# Patient Record
Sex: Female | Born: 1965
Health system: Southern US, Community
[De-identification: ages and names within clinical notes are randomized; demographics above are authoritative.]

## PROBLEM LIST (undated history)

## (undated) DIAGNOSIS — J310 Chronic rhinitis: Secondary | ICD-10-CM

## (undated) DIAGNOSIS — K648 Other hemorrhoids: Secondary | ICD-10-CM

## (undated) DIAGNOSIS — R112 Nausea with vomiting, unspecified: Secondary | ICD-10-CM

## (undated) DIAGNOSIS — Z9889 Other specified postprocedural states: Secondary | ICD-10-CM

## (undated) DIAGNOSIS — J3501 Chronic tonsillitis: Secondary | ICD-10-CM

## (undated) DIAGNOSIS — Z973 Presence of spectacles and contact lenses: Secondary | ICD-10-CM

## (undated) DIAGNOSIS — Z9109 Other allergy status, other than to drugs and biological substances: Secondary | ICD-10-CM

## (undated) DIAGNOSIS — J3489 Other specified disorders of nose and nasal sinuses: Secondary | ICD-10-CM

## (undated) HISTORY — PX: OVARIAN CYST SURGERY: SHX726

## (undated) HISTORY — PX: DILATION AND CURETTAGE OF UTERUS: SHX78

## (undated) HISTORY — PX: ABDOMINAL HYSTERECTOMY: SHX81

---

## 1997-08-29 HISTORY — PX: TOTAL VAGINAL HYSTERECTOMY: SHX2548

## 1997-11-27 HISTORY — PX: LAPAROSCOPIC SALPINGOOPHERECTOMY: SUR795

## 1998-05-20 ENCOUNTER — Inpatient Hospital Stay (HOSPITAL_COMMUNITY): Admission: RE | Admit: 1998-05-20 | Discharge: 1998-05-22 | Payer: Self-pay | Admitting: Obstetrics and Gynecology

## 1999-03-05 ENCOUNTER — Ambulatory Visit (HOSPITAL_COMMUNITY): Admission: RE | Admit: 1999-03-05 | Discharge: 1999-03-05 | Payer: Self-pay | Admitting: Family Medicine

## 1999-03-05 ENCOUNTER — Encounter: Payer: Self-pay | Admitting: Family Medicine

## 1999-08-24 ENCOUNTER — Encounter: Payer: Self-pay | Admitting: Family Medicine

## 1999-08-24 ENCOUNTER — Ambulatory Visit (HOSPITAL_COMMUNITY): Admission: RE | Admit: 1999-08-24 | Discharge: 1999-08-24 | Payer: Self-pay | Admitting: Family Medicine

## 2001-02-23 ENCOUNTER — Other Ambulatory Visit: Admission: RE | Admit: 2001-02-23 | Discharge: 2001-02-23 | Payer: Self-pay | Admitting: Obstetrics and Gynecology

## 2002-02-27 ENCOUNTER — Other Ambulatory Visit: Admission: RE | Admit: 2002-02-27 | Discharge: 2002-02-27 | Payer: Self-pay | Admitting: Obstetrics and Gynecology

## 2003-04-28 ENCOUNTER — Other Ambulatory Visit: Admission: RE | Admit: 2003-04-28 | Discharge: 2003-04-28 | Payer: Self-pay | Admitting: Obstetrics and Gynecology

## 2004-08-10 ENCOUNTER — Encounter: Admission: RE | Admit: 2004-08-10 | Discharge: 2004-08-10 | Payer: Self-pay | Admitting: Obstetrics and Gynecology

## 2004-09-24 ENCOUNTER — Other Ambulatory Visit: Admission: RE | Admit: 2004-09-24 | Discharge: 2004-09-24 | Payer: Self-pay | Admitting: Obstetrics and Gynecology

## 2005-10-28 ENCOUNTER — Encounter: Admission: RE | Admit: 2005-10-28 | Discharge: 2005-10-28 | Payer: Self-pay | Admitting: Obstetrics and Gynecology

## 2006-07-28 ENCOUNTER — Ambulatory Visit (HOSPITAL_COMMUNITY): Admission: RE | Admit: 2006-07-28 | Discharge: 2006-07-28 | Payer: Self-pay | Admitting: Obstetrics and Gynecology

## 2006-07-28 ENCOUNTER — Encounter (INDEPENDENT_AMBULATORY_CARE_PROVIDER_SITE_OTHER): Payer: Self-pay | Admitting: *Deleted

## 2006-07-28 HISTORY — PX: LAPAROSCOPIC SALPINGO OOPHERECTOMY: SHX5927

## 2006-11-16 ENCOUNTER — Encounter: Admission: RE | Admit: 2006-11-16 | Discharge: 2006-11-16 | Payer: Self-pay | Admitting: Obstetrics and Gynecology

## 2006-11-24 ENCOUNTER — Encounter: Admission: RE | Admit: 2006-11-24 | Discharge: 2006-11-24 | Payer: Self-pay | Admitting: Obstetrics and Gynecology

## 2007-12-12 ENCOUNTER — Encounter: Admission: RE | Admit: 2007-12-12 | Discharge: 2007-12-12 | Payer: Self-pay | Admitting: Obstetrics and Gynecology

## 2009-06-03 ENCOUNTER — Encounter: Admission: RE | Admit: 2009-06-03 | Discharge: 2009-06-03 | Payer: Self-pay | Admitting: Obstetrics and Gynecology

## 2009-07-26 ENCOUNTER — Emergency Department (HOSPITAL_COMMUNITY): Admission: EM | Admit: 2009-07-26 | Discharge: 2009-07-26 | Payer: Self-pay | Admitting: Emergency Medicine

## 2009-08-13 HISTORY — PX: BACK SURGERY: SHX140

## 2009-09-02 ENCOUNTER — Encounter: Admission: RE | Admit: 2009-09-02 | Discharge: 2009-09-02 | Payer: Self-pay | Admitting: Surgery

## 2010-06-17 ENCOUNTER — Encounter: Admission: RE | Admit: 2010-06-17 | Discharge: 2010-06-17 | Payer: Self-pay | Admitting: Obstetrics and Gynecology

## 2010-12-01 LAB — URINALYSIS, ROUTINE W REFLEX MICROSCOPIC
Glucose, UA: NEGATIVE mg/dL
Hgb urine dipstick: NEGATIVE
Nitrite: NEGATIVE
Protein, ur: NEGATIVE mg/dL
pH: 5.5 (ref 5.0–8.0)

## 2010-12-01 LAB — DIFFERENTIAL
Basophils Relative: 0 % (ref 0–1)
Lymphocytes Relative: 30 % (ref 12–46)
Monocytes Absolute: 0.2 10*3/uL (ref 0.1–1.0)
Monocytes Relative: 3 % (ref 3–12)

## 2010-12-01 LAB — CBC
Platelets: 144 10*3/uL — ABNORMAL LOW (ref 150–400)
RDW: 13.1 % (ref 11.5–15.5)
WBC: 6.7 10*3/uL (ref 4.0–10.5)

## 2010-12-01 LAB — BASIC METABOLIC PANEL
Chloride: 101 mEq/L (ref 96–112)
GFR calc Af Amer: >60 mL/min (ref >60)
Potassium: 4.6 mEq/L (ref 3.5–5.1)
Sodium: 135 mEq/L (ref 135–145)

## 2010-12-07 ENCOUNTER — Other Ambulatory Visit: Payer: Self-pay | Admitting: Family Medicine

## 2010-12-07 DIAGNOSIS — M545 Low back pain, unspecified: Secondary | ICD-10-CM

## 2010-12-08 ENCOUNTER — Other Ambulatory Visit: Payer: Self-pay

## 2010-12-10 ENCOUNTER — Ambulatory Visit
Admission: RE | Admit: 2010-12-10 | Discharge: 2010-12-10 | Disposition: A | Discharge status: Home / Self Care | Payer: PRIVATE HEALTH INSURANCE | Source: Ambulatory Visit | Attending: Family Medicine | Admitting: Family Medicine

## 2010-12-10 DIAGNOSIS — M545 Low back pain, unspecified: Secondary | ICD-10-CM

## 2010-12-31 ENCOUNTER — Encounter (HOSPITAL_COMMUNITY)
Admission: RE | Admit: 2010-12-31 | Discharge: 2010-12-31 | Disposition: A | Discharge status: Home / Self Care | Payer: PRIVATE HEALTH INSURANCE | Source: Ambulatory Visit | Attending: Neurosurgery | Admitting: Neurosurgery

## 2010-12-31 LAB — CBC
Hemoglobin: 13.9 g/dL (ref 12.0–15.0)
MCH: 31.7 pg (ref 26.0–34.0)
MCHC: 35.4 g/dL (ref 30.0–36.0)
MCV: 89.7 fL (ref 78.0–100.0)
Platelets: 126 10*3/uL — ABNORMAL LOW (ref 150–400)
RBC: 4.38 MIL/uL (ref 3.87–5.11)
RDW: 12.4 % (ref 11.5–15.5)
WBC: 5.6 10*3/uL (ref 4.0–10.5)

## 2010-12-31 LAB — BASIC METABOLIC PANEL
BUN: 21 mg/dL (ref 6–23)
CO2: 29 mEq/L (ref 19–32)
Calcium: 9.6 mg/dL (ref 8.4–10.5)
Chloride: 104 mEq/L (ref 96–112)
GFR calc Af Amer: >60 mL/min (ref >60)
Glucose, Bld: 85 mg/dL (ref 70–99)
Potassium: 4.2 mEq/L (ref 3.5–5.1)
Sodium: 139 mEq/L (ref 135–145)

## 2010-12-31 LAB — SURGICAL PCR SCREEN: Staphylococcus aureus: NEGATIVE

## 2011-01-06 ENCOUNTER — Ambulatory Visit (HOSPITAL_COMMUNITY): Payer: PRIVATE HEALTH INSURANCE

## 2011-01-06 ENCOUNTER — Ambulatory Visit (HOSPITAL_COMMUNITY)
Admission: RE | Admit: 2011-01-06 | Discharge: 2011-01-07 | Disposition: A | Discharge status: Home / Self Care | Payer: PRIVATE HEALTH INSURANCE | Source: Ambulatory Visit | Attending: Neurosurgery | Admitting: Neurosurgery

## 2011-01-06 DIAGNOSIS — M5126 Other intervertebral disc displacement, lumbar region: Secondary | ICD-10-CM | POA: Insufficient documentation

## 2011-01-06 DIAGNOSIS — Z01812 Encounter for preprocedural laboratory examination: Secondary | ICD-10-CM | POA: Insufficient documentation

## 2011-01-06 HISTORY — PX: LUMBAR DISC SURGERY: SHX700

## 2011-01-14 NOTE — H&P (Signed)
NAME:  Meredith Taylor, Meredith Taylor                 ACCOUNT NO.:  [id number]   MEDICAL RECORD NO.:  [id number]          PATIENT TYPE:  AMB   LOCATION:                                [facility]ILITY:  [facility]   PHYSICIAN:  Duke Salvia. Marcelle Overlie, M.D.    DATE OF BIRTH:   DATE OF ADMISSION:  07/28/2006  DATE OF DISCHARGE:                              HISTORY & PHYSICAL   CHIEF COMPLAINT:  Pelvic pain.   HISTORY OF PRESENT ILLNESS:  A 45 year old who has had a prior TVH RSO  on separate occasions and presents now for diagnostic laparoscopy and  possible LSO depending on the operative findings.  This procedure  including the possibility of laparotomy, risks related to bleeding,  infection, transfusion, adjacent organ injury, along with her expected  recovery time and the possible need for ERT all reviewed with her which  she understands and accepts.   Ultrasound a month ago showed multiple small cysts on her ovary on the  left.  A lot of her pain has been right midline and to the left  suggesting the possibility of adhesions.   MEDICATIONS:  TEGD p.r.n.   PAST SURGICAL HISTORY:  TVH in 1999.  In April of 1999 she had RSO for a  dermoid.  She has had laparoscopy for adhesions and a prior D&C.   ALLERGIES:  No known drug allergies.   FAMILY HISTORY:  Significant for a mother with mitral valve prolapse,  father who has had prior bypass surgery.   PHYSICAL EXAMINATION:  VITAL SIGNS:  Temperature 98.2, blood pressure  120/78.  HEENT:  Unremarkable.  NECK:  Supple without masses.  LUNGS:  Clear.  CARDIOVASCULAR:  Regular rate and rhythm without murmurs, rubs, or  gallops noted.  BREASTS:  Without masses.  ABDOMEN:  Soft, flat, and nontender.  PELVIC:  Normal external genitalia.  Vaginal cuff clear bimanual.  No  masses noted or unusual nodularity.   IMPRESSION:  Pelvic pain secondary to adhesions.   PLAN:  Diagnostic laparoscopy with lysis of adhesions, possible LSO,  possible laparotomy reviewed with her.   The procedure and risks reviewed  as above.      Richard M. Marcelle Overlie, M.D.  Electronically Signed     RMH/MEDQ  D:  07/17/2006  T:  07/17/2006  Job:  [id number]

## 2011-01-14 NOTE — Op Note (Signed)
NAME:  Meredith Taylor, Meredith Taylor                 ACCOUNT NO.:  [id number]   MEDICAL RECORD NO.:  [id number]          PATIENT TYPE:  AMB   LOCATION:  SDC                           [facility]ILITY:  [facility]   PHYSICIAN:  Duke Salvia. Marcelle Overlie, M.D.DATE OF BIRTH:  12/14/1965   DATE OF PROCEDURE:  07/28/2006  DATE OF DISCHARGE:                               OPERATIVE REPORT   PREOP DIAGNOSIS:  Chronic pelvic pain.   POSTOP DIAGNOSIS:  Adnexal adhesions plus left tubal and ovarian cyst.   PROCEDURES:  1. Diagnostic laparoscopy.  2. Lysis of adhesions with right salpingectomy.  3. Left salpingo-oophorectomy.   SURGEON:  Duke Salvia. Marcelle Overlie, M.D.   ANESTHESIA:  General endotracheal.   COMPLICATIONS:  None.   DRAINS:  In-and-out catheter.   BLOOD LOSS:  Minimal.   SPECIMENS:  Left tube and ovary, right tube.   PROCEDURE AND FINDINGS:  The patient was taken to the operating room and  after an adequate level of general diagnoses obtained, with the patient  legs stirrups; the abdomen, perineum and vagina prepped and draped for  laparoscopy.  The bladder was drained.  An EUA carried out; and no  masses were noted.  The subumbilical area was infiltrated with 1/2%  Marcaine plain.  A small incision was made and the Veress needle was  introduced without difficulty.  Its intra-abdominal position was  verified by pressure and water testing.  After to 2.5 liters a  pneumoperitoneum was then created.  A laparoscopic trocar and sleeve  were then introduced.  No evidence of any bleeding or trauma.  Three  fingerbreadths above the symphysis in the midline a 5-mm trocar was  inserted without difficulty.  The patient then placed in Trendelenburg;  and the pelvic findings as follows:   Her uterus and right ovary had been surgically removed.  The appendix  was unremarkable as was the upper abdomen.  The right tube was intact,  but the midportion of the tube was adherent with some filmy adhesions  and a single band to the  right pelvic sidewall.  The left ovary had a  cyst at one end; and two peritubular cysts.  Because of the significance  of her pain, we had discussed removal of her remaining ovary if  pathology was noted.   Therefore, the gyrus PK coag cutting instrument were used to coagulate  and cut the mesosalpinx on the right after lysis of adhesions excising  the right tube which was removed easily and sent to pathology.  This was  done after carefully noting the course of the ureter well below the  operative site.  On the left side the tube and ovaries were placed on  traction.  The course of the ureter was noted be well below.   The left IP ligament was coagulated and divided close to the uterus with  the gyrus PK instrument.  Once this was freed it was placed in a  laparoscopic bag.  The lower incision was enlarged, and a 10-mm port was  positioned to accommodate the lapper bag; and the specimen was removed,  intact, and  sent to pathology.  The area was inspected carefully and  noted to be hemostatic.  Instruments were removed and gas allowed to  escape.  The __skin_ closed with 4-0 Dexon subcuticular sutures.  The  lower incision in the fascia was closed with 2-0 Vicryl suture.  Subcuticular 4-0 and Dermabond.  She tolerated this well and went to  recovery room in good condition.      Richard M. Marcelle Overlie, M.D.  Electronically Signed     RMH/MEDQ  D:  07/28/2006  T:  07/28/2006  Job:  [id number]

## 2011-01-19 NOTE — Op Note (Signed)
NAME:  Meredith Taylor, Meredith Taylor                 ACCOUNT NO.:  [id number]  MEDICAL RECORD NO.:  [id number]           PATIENT TYPE:  O  LOCATION:  3535                         FACILITY:  [facility]  PHYSICIAN:  Cristi Loron, M.D.DATE OF BIRTH:  1965-11-15  DATE OF PROCEDURE:  01/06/2011 DATE OF DISCHARGE:  01/07/2011                              OPERATIVE REPORT   BRIEF HISTORY:  The patient is a 45 year old white female who suffered from back and left leg pain consistent with left S1 radiculopathy.  She has failed medical management and worked up with a lumbar MRI which demonstrated a herniated disk at L5-S1 on the left.  I discussed the various treatment options with the patient including surgery.  She has weighed the risks, benefits, and alternatives of surgery and decided to proceed with left L5-S1 diskectomy.  PREOPERATIVE DIAGNOSES:  Left L5-S1 herniated nucleus pulposus, spinal stenosis, lumbar radiculopathy, and lumbago.  POSTOPERATIVE DIAGNOSES:  Left L5-S1 herniated nucleus pulposus, spinal stenosis, lumbar radiculopathy, and lumbago.  PROCEDURE:  Left L5-S1 diskectomy using microdissection.  SURGEON:  Cristi Loron, MD  ASSISTANT:  Kathalene Frames, MD  ANESTHESIA:  General endotracheal.  ESTIMATED BLOOD LOSS:  25 mL.  SPECIMENS:  None.  DRAINS:  None.  COMPLICATIONS:  None.  DESCRIPTION OF PROCEDURE:  The patient was brought to the operating room by the Anesthesia team.  General endotracheal anesthesia was induced. The patient was turned to the prone position on the Wilson frame. Lumbosacral region was then prepared with Betadine scrub and Betadine solution.  Sterile drapes were applied.  I then injected the area to be incised with Marcaine with epinephrine solution.  I used a scalpel to make a linear midline incision over the L5-S1 interspace.  I used an electrocautery to perform a left-sided subperiosteal dissection exposing left spinous process of lamina at L5-S1  and upper sacrum.  We obtained intraoperative radiograph to confirm our location and inserted McCulloch retractor.  I then brought the operative microscope into the field under its magnification and illumination and completed the microdissection/decompression.  I used a high-speed drill to perform a left L5 laminotomy.  I widened the laminotomy with Kerrison punch and removed the left L5-S1 ligamentum flavum.  I performed the foraminotomy about the left S1 nerve root.  I then used microdissection to free up the thecal sac and the S1 nerve root from the epidural tissue.  We then gently retracted this medially with D'Errico retractor exposing underlying disk herniation and removed it with the pituitary forceps.  I then palpated along the ventral surface of the thecal sac along the exit route of the left S1 nerve root and noted that all neural structures were well decompressed.  We then obtained hemostasis using bipolar electrocautery.  We irrigated the wound out with bacitracin solution, then removed the retractor, and then reapproximated the patient's thoracolumbar fascia with interrupted #1 Vicryl suture, subcutaneous tissue with interrupted 2-0 Vicryl suture, and skin with Steri-Strips and benzoin.  The wound was then coated with bacitracin ointment.  A sterile dressing was applied.  The drapes were removed and the patient was subsequently returned to  supine position where she was extubated by the Anesthesia team and transported to the Postanesthesia Care Unit in stable condition.  All sponge, instrument, and needle counts were correct at the end of this case.     Cristi Loron, M.D.     JDJ/MEDQ  D:  01/10/2011  T:  01/11/2011  Job:  [id number]  Electronically Signed by Tressie Stalker M.D. on 01/19/2011 02:26:32 PM

## 2011-02-18 ENCOUNTER — Other Ambulatory Visit: Payer: Self-pay | Admitting: Neurosurgery

## 2011-02-18 ENCOUNTER — Ambulatory Visit
Admission: RE | Admit: 2011-02-18 | Discharge: 2011-02-18 | Disposition: A | Discharge status: Home / Self Care | Payer: PRIVATE HEALTH INSURANCE | Source: Ambulatory Visit | Attending: Neurosurgery | Admitting: Neurosurgery

## 2011-02-18 DIAGNOSIS — M79606 Pain in leg, unspecified: Secondary | ICD-10-CM

## 2011-05-09 IMAGING — CT CT PELVIS W/ CM
2 of 5 series · 17 of 46 positions shown, 19 images · IV contrast (APPLIED)
Comparison: None

CT ABDOMEN

CLINICAL DATA: Right-sided abdominal pain.

CT ABDOMEN AND PELVIS WITH CONTRAST
TECHNIQUE: Multidetector CT imaging of the abdomen and pelvis was
performed using the standard protocol following bolus
administration of intravenous contrast.
Contrast: 100 ml of Bmnipaque-TLL

[Series 2: abd_pel 5.0 b40f st · axial · 0.71mm/px · z∈[-650,-270]mm · 14 of 86 slices shown, 16 images]
[im 5/86  soft-tissue]
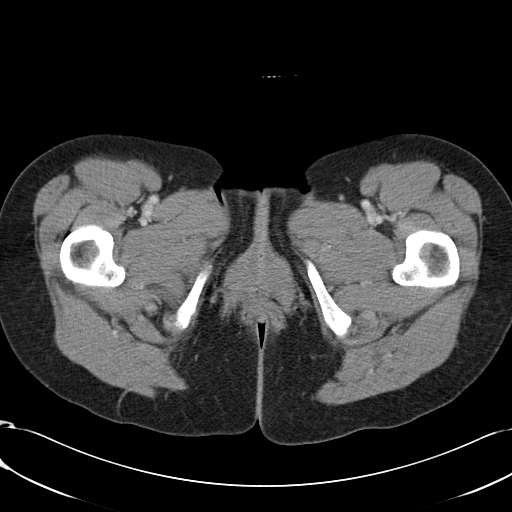
[im 5/86  bone]
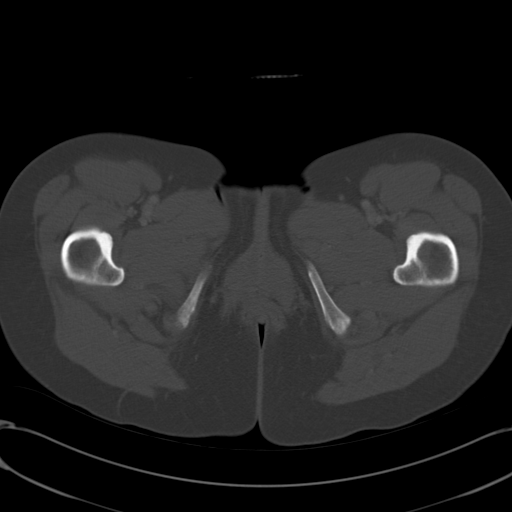
[im 13/86  soft-tissue]
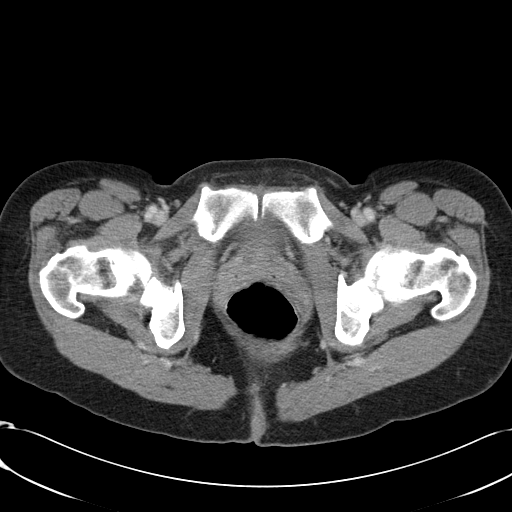
[im 18/86  soft-tissue]
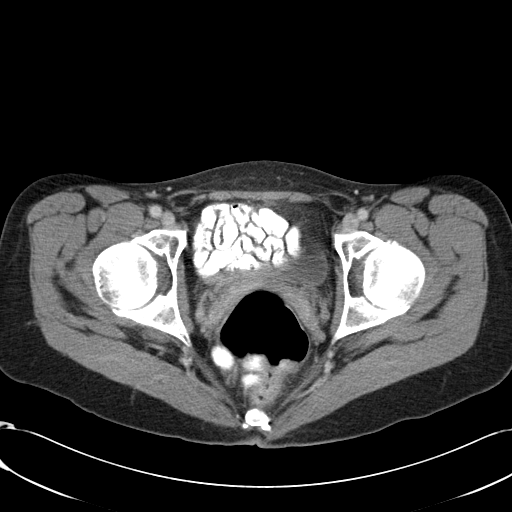
[im 22/86  soft-tissue]
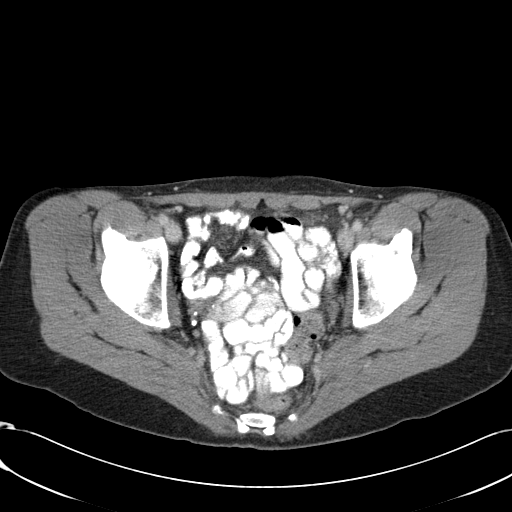
[im 30/86  soft-tissue]
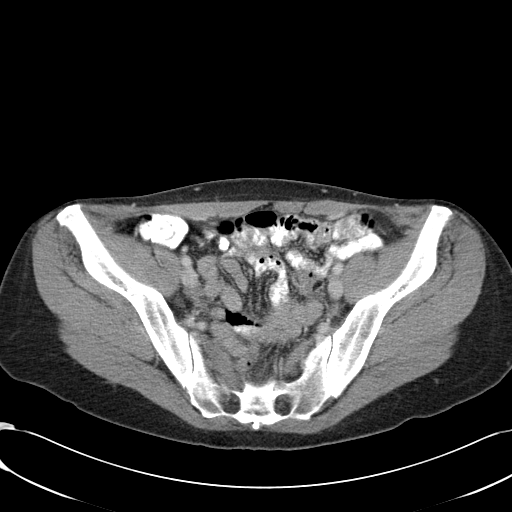
[im 35/86  soft-tissue]
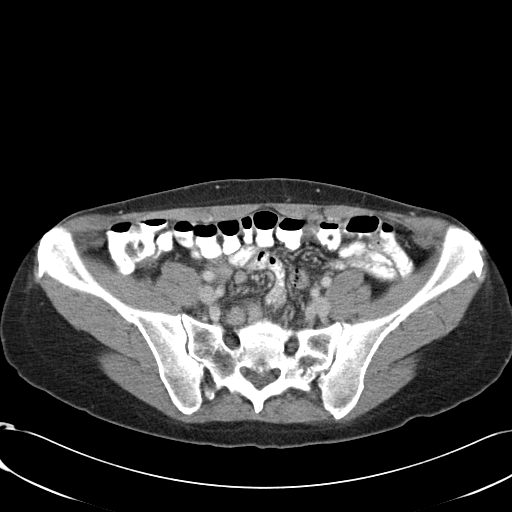
[im 39/86  soft-tissue]
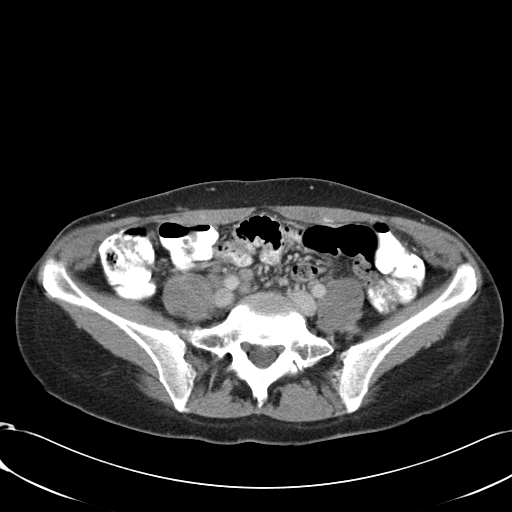
[im 47/86  soft-tissue]
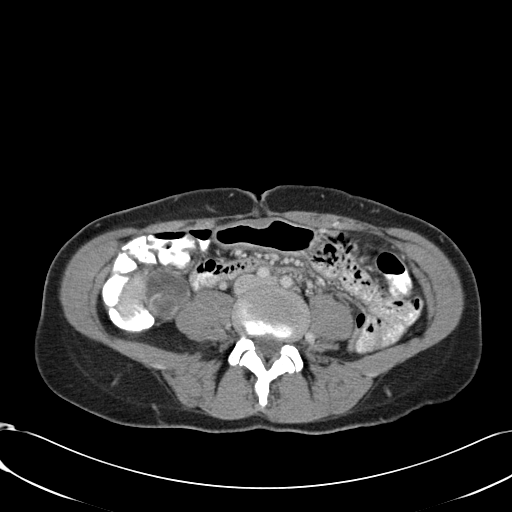
[im 52/86  soft-tissue]
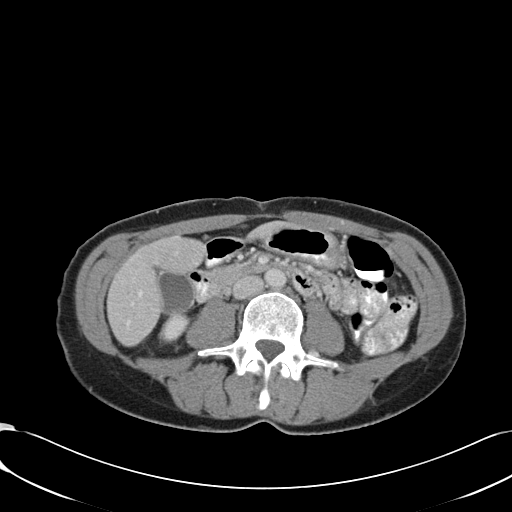
[im 52/86  bone]
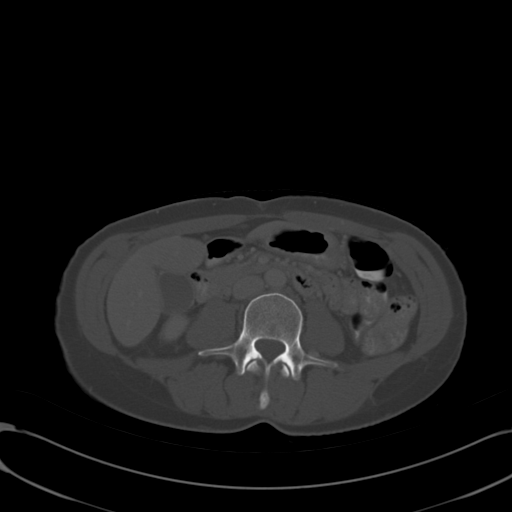
[im 56/86  soft-tissue]
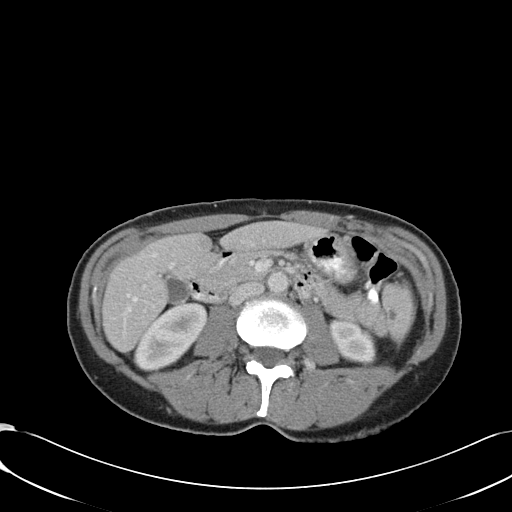
[im 64/86  soft-tissue]
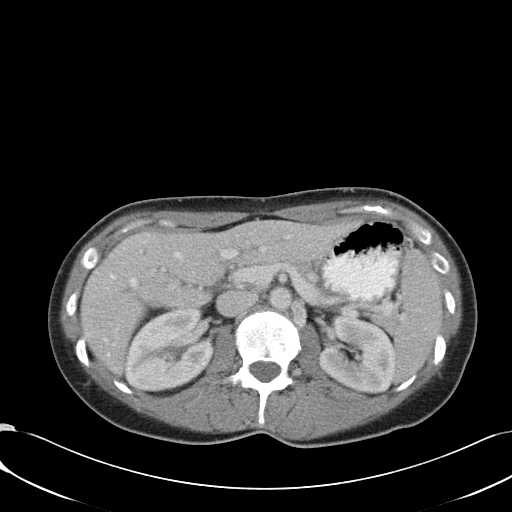
[im 69/86  soft-tissue]
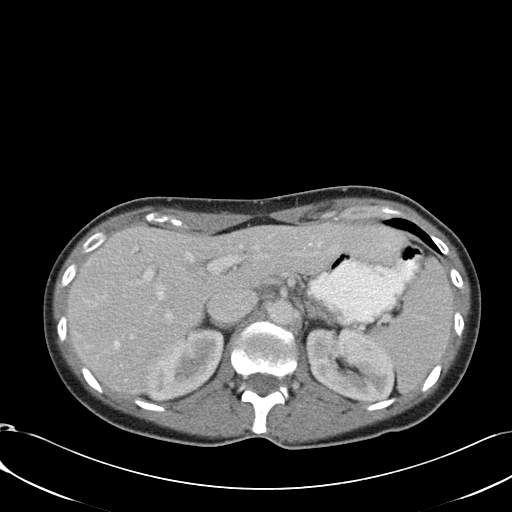
[im 73/86  soft-tissue]
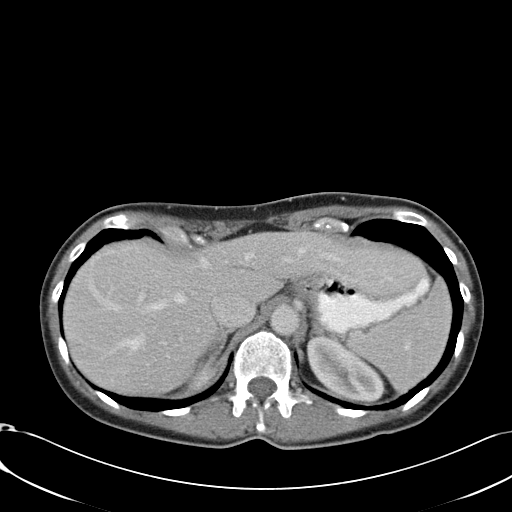
[im 81/86  soft-tissue]
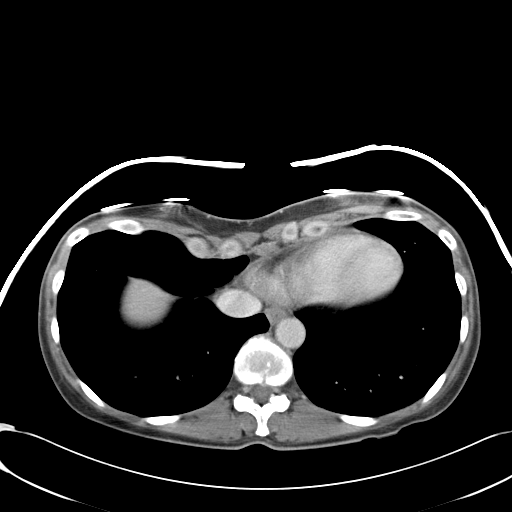

[Series 602: coronal · coronal · 0.87mm/px · 3 of 59 slices shown]
[im 20/59  soft-tissue]
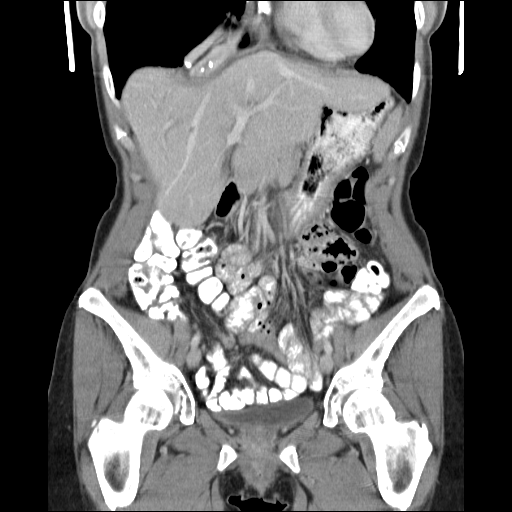
[im 26/59  soft-tissue]
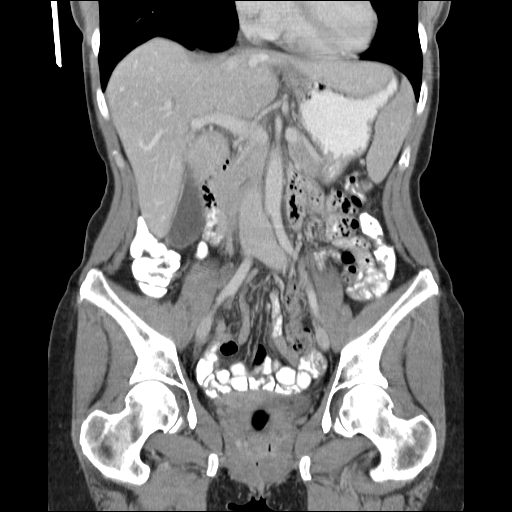
[im 33/59  soft-tissue]
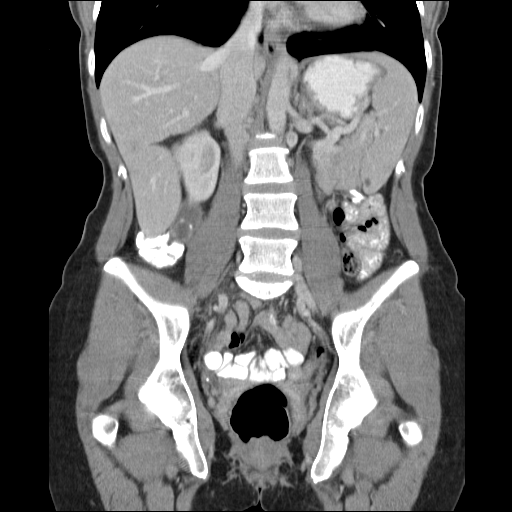

[17 of 46 positions shown; findings below may reference images not displayed]

FINDINGS: There is a fairly significant pectus deformity with mass
effect on the right ventricle.  The lung bases are clear.

There are small low attenuation liver lesions which are likely
benign cysts.  There is a vague indeterminate lesion in the
inferior aspect of the right lobe.  MRI followup without and with
contrast is suggested (non urgent) No biliary dilatation.  There is
a large gallstone in the gallbladder.  No pericholecystic
inflammatory change.

The spleen is normal in size.  The pancreas is unremarkable.  The
adrenal glands and kidneys are unremarkable except for small renal
cysts.

The stomach, duodenum, small bowel and colon grossly normal.  No
mesenteric or retroperitoneal masses or adenopathy.  The aorta is
normal in caliber and the major branch vessels are patent.

There is significant bony findings.
IMPRESSION: 1.  Vague indeterminate right hepatic lobe liver lesion.  A non
urgent follow-up MRI of the abdomen without with contrast is
suggested.  There are two other smaller lesions which are likely
cysts.
2.  No acute abdominal findings, mass lesions or adenopathy.
3.  18 mm gallstone noted the gallbladder.

CT PELVIS
FINDINGS: The rectum, sigmoid colon and visualized small bowel
loops are unremarkable.  The appendix is normal.  The bladder is
normal.  The patient has had a hysterectomy.  The bony pelvis is
intact.
IMPRESSION: No acute pelvic findings, mass lesions or adenopathy.

## 2012-05-09 ENCOUNTER — Other Ambulatory Visit: Payer: Self-pay | Admitting: Obstetrics and Gynecology

## 2012-05-09 DIAGNOSIS — Z1231 Encounter for screening mammogram for malignant neoplasm of breast: Secondary | ICD-10-CM

## 2012-05-22 ENCOUNTER — Ambulatory Visit
Admission: RE | Admit: 2012-05-22 | Discharge: 2012-05-22 | Disposition: A | Discharge status: Home / Self Care | Payer: BC Managed Care – PPO | Source: Ambulatory Visit | Attending: Obstetrics and Gynecology | Admitting: Obstetrics and Gynecology

## 2012-05-22 DIAGNOSIS — Z1231 Encounter for screening mammogram for malignant neoplasm of breast: Secondary | ICD-10-CM

## 2013-06-05 ENCOUNTER — Other Ambulatory Visit: Payer: Self-pay

## 2013-06-05 DIAGNOSIS — Z1231 Encounter for screening mammogram for malignant neoplasm of breast: Secondary | ICD-10-CM

## 2013-06-20 ENCOUNTER — Ambulatory Visit
Admission: RE | Admit: 2013-06-20 | Discharge: 2013-06-20 | Disposition: A | Discharge status: Home / Self Care | Payer: BC Managed Care – PPO | Source: Ambulatory Visit

## 2013-06-20 DIAGNOSIS — Z1231 Encounter for screening mammogram for malignant neoplasm of breast: Secondary | ICD-10-CM

## 2014-04-25 ENCOUNTER — Other Ambulatory Visit: Payer: Self-pay | Admitting: Obstetrics and Gynecology

## 2014-04-28 LAB — CYTOLOGY - PAP

## 2014-05-01 ENCOUNTER — Other Ambulatory Visit: Payer: Self-pay

## 2014-05-01 DIAGNOSIS — Z1231 Encounter for screening mammogram for malignant neoplasm of breast: Secondary | ICD-10-CM

## 2014-06-23 ENCOUNTER — Ambulatory Visit: Payer: BC Managed Care – PPO

## 2014-06-24 ENCOUNTER — Ambulatory Visit
Admission: RE | Admit: 2014-06-24 | Discharge: 2014-06-24 | Disposition: A | Discharge status: Home / Self Care | Payer: Self-pay | Source: Ambulatory Visit

## 2014-06-24 ENCOUNTER — Encounter (INDEPENDENT_AMBULATORY_CARE_PROVIDER_SITE_OTHER): Payer: Self-pay

## 2014-06-24 DIAGNOSIS — Z1231 Encounter for screening mammogram for malignant neoplasm of breast: Secondary | ICD-10-CM

## 2014-06-25 ENCOUNTER — Other Ambulatory Visit: Payer: Self-pay | Admitting: Obstetrics and Gynecology

## 2014-06-25 DIAGNOSIS — N644 Mastodynia: Secondary | ICD-10-CM

## 2014-07-08 ENCOUNTER — Ambulatory Visit
Admission: RE | Admit: 2014-07-08 | Discharge: 2014-07-08 | Disposition: A | Discharge status: Home / Self Care | Payer: 59 | Source: Ambulatory Visit | Attending: Obstetrics and Gynecology | Admitting: Obstetrics and Gynecology

## 2014-07-08 DIAGNOSIS — N644 Mastodynia: Secondary | ICD-10-CM

## 2014-08-01 ENCOUNTER — Other Ambulatory Visit: Payer: Self-pay | Admitting: Obstetrics and Gynecology

## 2014-08-01 ENCOUNTER — Ambulatory Visit
Admission: RE | Admit: 2014-08-01 | Discharge: 2014-08-01 | Disposition: A | Discharge status: Home / Self Care | Payer: 59 | Source: Ambulatory Visit | Attending: Obstetrics and Gynecology | Admitting: Obstetrics and Gynecology

## 2014-08-01 DIAGNOSIS — R079 Chest pain, unspecified: Secondary | ICD-10-CM

## 2015-04-28 ENCOUNTER — Other Ambulatory Visit: Payer: Self-pay | Admitting: Obstetrics and Gynecology

## 2015-04-29 LAB — CYTOLOGY - PAP

## 2015-06-18 ENCOUNTER — Other Ambulatory Visit: Payer: Self-pay

## 2015-06-18 DIAGNOSIS — Z1231 Encounter for screening mammogram for malignant neoplasm of breast: Secondary | ICD-10-CM

## 2015-07-21 ENCOUNTER — Ambulatory Visit
Admission: RE | Admit: 2015-07-21 | Discharge: 2015-07-21 | Disposition: A | Discharge status: Home / Self Care | Payer: Managed Care, Other (non HMO) | Source: Ambulatory Visit

## 2015-07-21 DIAGNOSIS — Z1231 Encounter for screening mammogram for malignant neoplasm of breast: Secondary | ICD-10-CM

## 2015-11-28 HISTORY — PX: SHOULDER SURGERY: SHX246

## 2015-12-23 ENCOUNTER — Ambulatory Visit: Payer: Managed Care, Other (non HMO) | Attending: Specialist | Admitting: Physical Therapy

## 2015-12-23 ENCOUNTER — Encounter: Payer: Self-pay | Admitting: Physical Therapy

## 2015-12-23 DIAGNOSIS — M25612 Stiffness of left shoulder, not elsewhere classified: Secondary | ICD-10-CM | POA: Insufficient documentation

## 2015-12-23 DIAGNOSIS — M25512 Pain in left shoulder: Secondary | ICD-10-CM | POA: Diagnosis not present

## 2015-12-23 NOTE — Patient Instructions (Signed)
External Rotation (Isometric)   With elbow bent and held at side, use other hand to apply resistance to outward motion of arm. Hold _10___ seconds, 10 reps, 2 sets, 2 times per day.    Flexion (Isometric)   Press right fist against wall. Hold _10___ seconds. Repeat _10___ times. 2 sets, Do _2___ sessions per day.  Extension (Isometric)   Place left bent elbow and back of arm against wall. Press elbow against wall. Hold _10___ seconds.  Repeat __10__ times, 2 sets,  Do __2__ sessions per day.    Internal Rotation (Isometric)   Place palm of right fist against door frame, with elbow bent. Press fist against door frame. Hold _10___ seconds.  Repeat _10___ times, 2 sets, Do __2__ sessions per day.  Strengthening: Isometric Abduction   Using wall for resistance, press left arm into wall using light pressure. Hold _10___ seconds. Repeat _10___ times per set. Do _2___ sets per session. Do __2__ sessions per day.

## 2015-12-23 NOTE — Therapy (Signed)
[facility] [address] [place], [place], [address] Phone: [phone]   Fax:  [phone]  Physical Therapy Evaluation  Patient Details  Name: Meredith Taylor MRN: [id number] Date of Birth: 1965-09-24 Referring Provider: RA collins  Encounter Date: 12/23/2015      PT End of Session - 12/23/15 1631    Visit Number 1   Date for PT Re-Evaluation 02/22/16   PT Start Time 1609   PT Stop Time 1700   PT Time Calculation (min) 51 min   Activity Tolerance Patient tolerated treatment well  very difficult to relax, very gaurded   Behavior During Therapy Anxious;WFL for tasks assessed/performed      History reviewed. No pertinent past medical history.  History reviewed. No pertinent past surgical history.  There were no vitals filed for this visit.       Subjective Assessment - 12/23/15 1609    Subjective Patient reports a left shoulder injury about 2-3 years ago.  She reports that she underwent a left shoulder labral repair on 12/08/15.  She reports that prior to surgery she had lost some ROM   Patient Stated Goals have better motions with out pain   Currently in Pain? Yes   Pain Score 4    Pain Location Shoulder   Pain Orientation Left;Posterior   Pain Descriptors / Indicators Aching   Pain Type Surgical pain   Pain Onset 1 to 4 weeks ago   Pain Frequency Constant   Aggravating Factors  sitting at desk  pain after work up to 8-9/10   Pain Relieving Factors ice, rest and pain, at best a 1/10   Effect of Pain on Daily Activities limits every thing            [facility] PT Assessment - 12/23/15 0001    Assessment   Medical Diagnosis S/P left labral repair   Referring Provider RA collins   Onset Date/Surgical Date 12/08/15   Hand Dominance Right   Prior Therapy no   Precautions   Precaution Comments follow MD protocol   Balance Screen   Has the patient fallen in the past 6 months No   Has the patient had a  decrease in activity level because of a fear of falling?  No   Is the patient reluctant to leave their home because of a fear of falling?  No   Home Environment   Additional Comments does housework   Prior Function   Level of Independence Independent   Vocation Full time employment   Vocation Requirements mostly sitting at computer   Leisure no exercise   Posture/Postural Control   Posture Comments gaurded posture, in sling   ROM / Strength   AROM / PROM / Strength PROM   PROM   PROM Assessment Site Shoulder   Right/Left Shoulder Left   Left Shoulder Flexion 60 Degrees  scaption to 60   Left Shoulder Internal Rotation 45 Degrees  in 45 of ABD   Left Shoulder External Rotation 10 Degrees  in 45 of ABD   Palpation   Palpation comment she is very tender to the left shoulder and into the left upper trap and neck, has some c/o pain in the left clavicle                   [facility] Treatment/Exercise - 12/23/15 0001    Modalities   Modalities Electrical Stimulation;Vasopneumatic   Programme researcher, broadcasting/film/videolectrical Stimulation   Electrical Stimulation  Location left shoulder   Electrical Stimulation Action IFC   Electrical Stimulation Goals Pain   Vasopneumatic   Number Minutes Vasopneumatic  15 minutes   Vasopnuematic Location  Shoulder   Vasopneumatic Pressure Medium   Vasopneumatic Temperature  39                  PT Short Term Goals - 12/23/15 1641    PT SHORT TERM GOAL #1   Title independent with initial HEP   Time 2   Period Weeks   Status New           PT Long Term Goals - 12/23/15 1641    PT LONG TERM GOAL #1   Title independent with RICE   Time 8   Period Weeks   Status New   PT LONG TERM GOAL #2   Title able to dress and do hair without difficulty   Time 12   Period Weeks   Status New   PT LONG TERM GOAL #3   Title increase AROM of shoulder flexion to WFL's   Time 12   Period Weeks   Status New   PT LONG TERM GOAL #4   Title increase ER/IR  actively to 60 degrees    Time 12   Period Weeks   Status New               Plan - 12/23/15 1635    Clinical Impression Statement Patient injured the left shoulder about 2 years ago, she reports a labral repair SLAP type II on December 08, 2015.  She is in pain and very gaurded, wears a sling, reports that she returned to work this week and has some increased pain,   Rehab Potential Good   PT Frequency 2x / week   PT Duration 8 weeks   PT Treatment/Interventions ADLs/Self Care Home Management;Cryotherapy;Civil engineer, contracting;Therapeutic activities;Therapeutic exercise;Patient/family education;Manual techniques   PT Next Visit Plan Follow MD protocol.  Do not load biceps, go over HEP to assure no issues.  Next week could progress to week 3 of protocol   Consulted and Agree with Plan of Care Patient      Patient will benefit from skilled therapeutic intervention in order to improve the following deficits and impairments:  Decreased range of motion, Increased edema, Impaired flexibility, Increased muscle spasms, Impaired UE functional use, Postural dysfunction, Improper body mechanics, Pain  Visit Diagnosis: Pain in left shoulder - Plan: PT plan of care cert/re-cert  Stiffness of left shoulder, not elsewhere classified - Plan: PT plan of care cert/re-cert     Problem List There are no active problems to display for this patient.   Jearld Lesch., PT 12/23/2015, 4:46 PM  [facility] [address] [place], [place], [address] Phone: [phone]   Fax:  [phone]  Name: Meredith Taylor MRN: [id number] Date of Birth: [birth year]

## 2015-12-25 ENCOUNTER — Ambulatory Visit: Payer: Managed Care, Other (non HMO) | Admitting: Physical Therapy

## 2015-12-29 ENCOUNTER — Encounter: Payer: Self-pay | Admitting: Physical Therapy

## 2015-12-29 ENCOUNTER — Ambulatory Visit: Payer: Managed Care, Other (non HMO) | Attending: Specialist | Admitting: Physical Therapy

## 2015-12-29 DIAGNOSIS — M25612 Stiffness of left shoulder, not elsewhere classified: Secondary | ICD-10-CM | POA: Insufficient documentation

## 2015-12-29 DIAGNOSIS — R2232 Localized swelling, mass and lump, left upper limb: Secondary | ICD-10-CM | POA: Diagnosis present

## 2015-12-29 DIAGNOSIS — M25512 Pain in left shoulder: Secondary | ICD-10-CM | POA: Diagnosis not present

## 2015-12-29 NOTE — Therapy (Signed)
[facility] [address] [place], [place], [address] Phone: [phone]   Fax:  [phone]  Physical Therapy Treatment  Patient Details  Name: Meredith Taylor MRN: [id number] Date of Birth: 1966-08-03 Referring Provider: RA collins  Encounter Date: 12/29/2015      PT End of Session - 12/29/15 0848    Visit Number 2   Date for PT Re-Evaluation 02/22/16   PT Start Time 0800   PT Stop Time 0858   PT Time Calculation (min) 58 min   Activity Tolerance Patient tolerated treatment well   Behavior During Therapy [facility] for tasks assessed/performed      History reviewed. No pertinent past medical history.  History reviewed. No pertinent past surgical history.  There were no vitals filed for this visit.      Subjective Assessment - 12/29/15 0802    Subjective Patient states she is feeling good this morning, with only 2/10 pain in Lt shoulder. She has been doing her exercises at home, but is struggling to relax in the sling and during AAROM.   Currently in Pain? Yes   Pain Score 2    Pain Location Shoulder   Pain Orientation Left                         [facility] Adult PT Treatment/Exercise - 12/29/15 0001    Exercises   Exercises Shoulder   Shoulder Exercises: Seated   Retraction Both;10 reps  3 sec hold   Shoulder Exercises: Pulleys   Flexion 3 minutes   Flexion Limitations to 90   ABduction 2 minutes   ABduction Limitations to 60   Other Pulley Exercises ER to 30 in 60 scaption x2 min   Other Pulley Exercises IR to ~45 in 60 scaption x2 min   Shoulder Exercises: ROM/Strengthening   Other ROM/Strengthening Exercises Ball on Table 2 min flexion/CW/CCW   Shoulder Exercises: Isometric Strengthening   Extension 5X5"   External Rotation 5X5"   Internal Rotation 5X5"   ABduction 5X5"   Modalities   Modalities Electrical Stimulation;Vasopneumatic   Electrical Stimulation   Electrical Stimulation  Location left shoulder   Electrical Stimulation Action IFC   Electrical Stimulation Goals Pain   Vasopneumatic   Number Minutes Vasopneumatic  15 minutes   Vasopnuematic Location  Shoulder   Vasopneumatic Pressure Medium   Manual Therapy   Manual Therapy Passive ROM   Passive ROM Lt shoulder; flex to 90, ABD/scaption to 60, ER to 30 and IR to ~45 in 60 scaption                  PT Short Term Goals - 12/29/15 0848    PT SHORT TERM GOAL #1   Title independent with initial HEP   Status Achieved           PT Long Term Goals - 12/23/15 1641    PT LONG TERM GOAL #1   Title independent with RICE   Time 8   Period Weeks   Status New   PT LONG TERM GOAL #2   Title able to dress and do hair without difficulty   Time 12   Period Weeks   Status New   PT LONG TERM GOAL #3   Title increase AROM of shoulder flexion to WFL's   Time 12   Period Weeks   Status New   PT LONG TERM GOAL #4   Title increase ER/IR  actively to 60 degrees    Time 12   Period Weeks   Status New               Plan - 12/29/15 0848    Clinical Impression Statement Patient was able to perform all AAROM today along MD protocol, with difficulty allowing her Rt shoulder to control the movements. Added scapular retraction, ball rolls, and pulleys. Difficult for pt to relax during MT. ROM continues to be very good, with slightly increased pain into 90 degress of flexion.    PT Treatment/Interventions ADLs/Self Care Home Management;Cryotherapy;Civil engineer, contracting;Therapeutic activities;Therapeutic exercise;Patient/family education;Manual techniques   PT Next Visit Plan Continue to follow MD protocol, with no biceps loading. Continue AAROM. MT to keep ROM, with cues for pt to relax.      Patient will benefit from skilled therapeutic intervention in order to improve the following deficits and impairments:     Visit Diagnosis: Pain in left shoulder  Stiffness of left  shoulder, not elsewhere classified  Localized swelling, mass and lump, left upper limb     Problem List There are no active problems to display for this patient.   Meredith Taylor SPTA 12/29/2015, 9:28 AM  [facility] [address] [place], [place], [address] Phone: [phone]   Fax:  [phone]  Name: Meredith Taylor MRN: [id number] Date of Birth: [birth year]

## 2016-01-01 ENCOUNTER — Ambulatory Visit: Payer: Managed Care, Other (non HMO) | Admitting: Physical Therapy

## 2016-01-01 ENCOUNTER — Encounter: Payer: Self-pay | Admitting: Physical Therapy

## 2016-01-01 DIAGNOSIS — M25512 Pain in left shoulder: Secondary | ICD-10-CM | POA: Diagnosis not present

## 2016-01-01 DIAGNOSIS — M25612 Stiffness of left shoulder, not elsewhere classified: Secondary | ICD-10-CM

## 2016-01-01 DIAGNOSIS — R2232 Localized swelling, mass and lump, left upper limb: Secondary | ICD-10-CM

## 2016-01-01 NOTE — Therapy (Signed)
[facility] [address] [place], [place], [address] Phone: [phone]   Fax:  [phone]  Physical Therapy Treatment  Patient Details  Name: Meredith Taylor MRN: [id number] Date of Birth: August 05, 1966 Referring Provider: RA collins  Encounter Date: 01/01/2016      PT End of Session - 01/01/16 0856    Visit Number 3   Date for PT Re-Evaluation 02/22/16   PT Start Time 0806   PT Stop Time 0908   PT Time Calculation (min) 62 min   Activity Tolerance Patient tolerated treatment well   Behavior During Therapy [facility] for tasks assessed/performed      History reviewed. No pertinent past medical history.  History reviewed. No pertinent past surgical history.  There were no vitals filed for this visit.      Subjective Assessment - 01/01/16 0804    Subjective Patient stated her muscles feel tweaked and knotted up. She reports she has been doing her exercises, but her muscles start to spasm.    Currently in Pain? Yes   Pain Score 3    Pain Location Shoulder   Pain Orientation Left                         [facility] Adult PT Treatment/Exercise - 01/01/16 0001    Shoulder Exercises: Seated   Retraction 20 reps  3 second hold   Shoulder Exercises: Pulleys   Flexion 3 minutes   Flexion Limitations to 90   ABduction 2 minutes   ABduction Limitations to 60   Other Pulley Exercises ER to 30 in 60 scaption x2 min   Other Pulley Exercises IR to ~45 in 60 scaption x2 min   Shoulder Exercises: ROM/Strengthening   Other ROM/Strengthening Exercises Ball on Table 2 min flexion/CW/CCW   Shoulder Exercises: Isometric Strengthening   Extension 5X5"  15 reps   External Rotation 5X5"  15 reps   Internal Rotation 5X5"  15 reps   ABduction 5X5"  15 reps   Modalities   Modalities Electrical Stimulation;Vasopneumatic   Electrical Stimulation   Electrical Stimulation Location left shoulder   Electrical Stimulation  Action IFC   Electrical Stimulation Goals Pain   Vasopneumatic   Number Minutes Vasopneumatic  15 minutes   Vasopnuematic Location  Shoulder   Vasopneumatic Pressure Medium   Vasopneumatic Temperature  40   Manual Therapy   Manual Therapy Passive ROM;Taping;Soft tissue mobilization   Passive ROM Lt shoulder; flex to 90, ABD/scaption to 60, ER to 30 and IR to ~45 in 60 scaption   Kinesiotex Inhibit Muscle   Kinesiotix   Create Space --   Inhibit Muscle  Along deltoid   Facilitate Muscle  --                  PT Short Term Goals - 12/29/15 0848    PT SHORT TERM GOAL #1   Title independent with initial HEP   Status Achieved           PT Long Term Goals - 12/23/15 1641    PT LONG TERM GOAL #1   Title independent with RICE   Time 8   Period Weeks   Status New   PT LONG TERM GOAL #2   Title able to dress and do hair without difficulty   Time 12   Period Weeks   Status New   PT LONG TERM GOAL #3   Title increase AROM  of shoulder flexion to WFL's   Time 12   Period Weeks   Status New   PT LONG TERM GOAL #4   Title increase ER/IR actively to 60 degrees    Time 12   Period Weeks   Status New               Plan - 01/01/16 0857    Clinical Impression Statement Patient is having increased tightness and tenderness along anterior deltoid, especially at the insertion on the humerus. Continues to have good PROM, with difficulty relaxing during MT. Performed all AAROM along protocol, with slight increase in pain into flexion and abduction. Applied kinesiotape to inhibit tension on deltoid.   PT Treatment/Interventions ADLs/Self Care Home Management;Cryotherapy;Civil engineer, contracting;Therapeutic activities;Therapeutic exercise;Patient/family education;Manual techniques   PT Next Visit Plan Assess response to kinesiotape use. Continue to follow MD protocol, MT to keep ROM.       Patient will benefit from skilled therapeutic intervention in  order to improve the following deficits and impairments:     Visit Diagnosis: Pain in left shoulder  Stiffness of left shoulder, not elsewhere classified  Localized swelling, mass and lump, left upper limb     Problem List There are no active problems to display for this patient.   Paxtyn Wisdom SPTA 01/01/2016, 9:19 AM  [facility] [address] [place], [place], [address] Phone: [phone]   Fax:  [phone]  Name: Meredith Taylor MRN: [id number] Date of Birth: 01-04-1966

## 2016-01-08 ENCOUNTER — Encounter: Payer: Self-pay | Admitting: Physical Therapy

## 2016-01-08 ENCOUNTER — Ambulatory Visit: Payer: Managed Care, Other (non HMO) | Admitting: Physical Therapy

## 2016-01-08 DIAGNOSIS — M25612 Stiffness of left shoulder, not elsewhere classified: Secondary | ICD-10-CM

## 2016-01-08 DIAGNOSIS — M25512 Pain in left shoulder: Secondary | ICD-10-CM | POA: Diagnosis not present

## 2016-01-08 DIAGNOSIS — R2232 Localized swelling, mass and lump, left upper limb: Secondary | ICD-10-CM

## 2016-01-08 NOTE — Therapy (Signed)
[facility] [place] [facility] [address] [place], [place], [address] Phone: [phone]   Fax:  [phone]  Physical Therapy Treatment  Patient Details  Name: Meredith Taylor MRN: [id number] Date of Birth: 24-Jun-1966 Referring Provider: RA collins  Encounter Date: 01/08/2016      PT End of Session - 01/08/16 0846    Visit Number 4   Date for PT Re-Evaluation 02/22/16   PT Start Time 0757   PT Stop Time 0855   PT Time Calculation (min) 58 min   Activity Tolerance Patient tolerated treatment well   Behavior During Therapy [facility] for tasks assessed/performed      History reviewed. No pertinent past medical history.  History reviewed. No pertinent past surgical history.  There were no vitals filed for this visit.      Subjective Assessment - 01/08/16 0800    Subjective Patient reports that she is unsure that the tape helped.  She continues to have a knot in the left lateral deltoid area.   Currently in Pain? Yes   Pain Score 3    Pain Location Shoulder   Pain Orientation Left;Lateral   Pain Descriptors / Indicators Aching   Pain Type Surgical pain   Aggravating Factors  by the end of the dya pain can be a 9/10   Pain Relieving Factors ice helps a little            [facility] PT Assessment - 01/08/16 0001    PROM   Left Shoulder Flexion 90 Degrees  scaption 60   Left Shoulder Internal Rotation 60 Degrees   Left Shoulder External Rotation 30 Degrees                     [facility] Adult PT Treatment/Exercise - 01/08/16 0001    Shoulder Exercises: Supine   External Rotation Strengthening;20 reps;Theraband   Theraband Level (Shoulder External Rotation) Level 1 (Yellow)   Other Supine Exercises isometric scapular retraction   Other Supine Exercises shoulder shrugs and rolls   Shoulder Exercises: Pulleys   Flexion 3 minutes   Flexion Limitations to 90   Modalities   Modalities Ultrasound   Electrical Stimulation    Electrical Stimulation Location left shoulder more on the deltoid tofday   Electrical Stimulation Action IFC   Electrical Stimulation Parameters sitting   Electrical Stimulation Goals Pain   Ultrasound   Ultrasound Location left deltoid area   Ultrasound Parameters 100%   Ultrasound Goals Pain   Vasopneumatic   Number Minutes Vasopneumatic  15 minutes   Vasopnuematic Location  Shoulder   Vasopneumatic Pressure Medium   Vasopneumatic Temperature  40   Manual Therapy   Manual Therapy Passive ROM   Passive ROM Lt shoulder; flex to 90, ABD/scaption to 60, ER to 30 and IR to ~45 in 60 scaption                  PT Short Term Goals - 12/29/15 0848    PT SHORT TERM GOAL #1   Title independent with initial HEP   Status Achieved           PT Long Term Goals - 12/23/15 1641    PT LONG TERM GOAL #1   Title independent with RICE   Time 8   Period Weeks   Status New   PT LONG TERM GOAL #2   Title able to dress and do hair without difficulty   Time 12  Period Weeks   Status New   PT LONG TERM GOAL #3   Title increase AROM of shoulder flexion to WFL's   Time 12   Period Weeks   Status New   PT LONG TERM GOAL #4   Title increase ER/IR actively to 60 degrees    Time 12   Period Weeks   Status New               Plan - 01/08/16 0847    Clinical Impression Statement Patient still with difficulty relaxing, remains gaurded but her ROM is at the limit of the protocol so she is oing well, I just think the gaurding is causing the pain   PT Next Visit Plan Write MD note next visit   Consulted and Agree with Plan of Care Patient      Patient will benefit from skilled therapeutic intervention in order to improve the following deficits and impairments:  Decreased range of motion, Increased edema, Impaired flexibility, Increased muscle spasms, Impaired UE functional use, Postural dysfunction, Improper body mechanics, Pain  Visit Diagnosis: Pain in left  shoulder  Stiffness of left shoulder, not elsewhere classified  Localized swelling, mass and lump, left upper limb     Problem List There are no active problems to display for this patient.   Jearld LeschALBRIGHT,Linna Thebeau W., PT 01/08/2016, 8:48 AM  [facility] [address] [place], [place], [address] Phone: [phone]   Fax:  [phone]  Name: Haze JustinCarrie K Ramirez MRN: [id number] Date of Birth: Apr 13, 1966

## 2016-01-12 ENCOUNTER — Ambulatory Visit: Payer: Managed Care, Other (non HMO) | Admitting: Physical Therapy

## 2016-01-12 ENCOUNTER — Encounter: Payer: Self-pay | Admitting: Physical Therapy

## 2016-01-12 DIAGNOSIS — R2232 Localized swelling, mass and lump, left upper limb: Secondary | ICD-10-CM

## 2016-01-12 DIAGNOSIS — M25512 Pain in left shoulder: Secondary | ICD-10-CM

## 2016-01-12 DIAGNOSIS — M25612 Stiffness of left shoulder, not elsewhere classified: Secondary | ICD-10-CM

## 2016-01-12 NOTE — Therapy (Signed)
[facility] [address] [place], [place], [address] Phone: [phone]   Fax:  [phone]  Physical Therapy Treatment  Patient Details  Name: Meredith Taylor MRN: [id number] Date of Birth: Aug 14, 1966 Referring Provider: RA collins  Encounter Date: 01/12/2016      PT End of Session - 01/12/16 0832    Visit Number 5   Date for PT Re-Evaluation 02/22/16   PT Start Time 0756   PT Stop Time 0850   PT Time Calculation (min) 54 min      History reviewed. No pertinent past medical history.  History reviewed. No pertinent past surgical history.  There were no vitals filed for this visit.      Subjective Assessment - 01/12/16 0757    Subjective oaky until end of day,still guarding   Currently in Pain? Yes   Pain Score 2    Pain Location Shoulder   Pain Orientation Left;Lateral                         [facility] Adult PT Treatment/Exercise - 01/12/16 0001    Shoulder Exercises: Supine   Other Supine Exercises yellow tband ext and retraction 15 times each   Other Supine Exercises shoulder shrugs and rolls  4#   Shoulder Exercises: Standing   External Rotation Strengthening;Left;15 reps;Theraband  2 sets   Theraband Level (Shoulder External Rotation) Level 1 (Yellow)   Internal Rotation Strengthening;Left;15 reps;Theraband  2 sets   Theraband Level (Shoulder Internal Rotation) Level 1 (Yellow)   Other Standing Exercises finger ladder 5 times flex to 90 and abd to 60   Other Standing Exercises wall slides flex to 90 and abd to 60 15 times each  wall circles 10 times each way   Electrical Stimulation   Electrical Stimulation Location left shoulder more on the deltoid tofday   Electrical Stimulation Action IFC   Ultrasound   Ultrasound Location left shld/delt   Ultrasound Parameters same as 5/12   Vasopneumatic   Number Minutes Vasopneumatic  15 minutes   Vasopnuematic Location  Shoulder   Vasopneumatic Pressure Medium   Vasopneumatic Temperature  40   Manual Therapy   Manual Therapy Passive ROM;Soft tissue mobilization   Soft tissue mobilization left delt trigger point   Passive ROM Lt shoulder; flex to 90, ABD/scaption to 60, ER to 30 and IR to ~45 in 60 scaption  at limits of protocol                  PT Short Term Goals - 12/29/15 0848    PT SHORT TERM GOAL #1   Title independent with initial HEP   Status Achieved           PT Long Term Goals - 12/23/15 1641    PT LONG TERM GOAL #1   Title independent with RICE   Time 8   Period Weeks   Status New   PT LONG TERM GOAL #2   Title able to dress and do hair without difficulty   Time 12   Period Weeks   Status New   PT LONG TERM GOAL #3   Title increase AROM of shoulder flexion to WFL's   Time 12   Period Weeks   Status New   PT LONG TERM GOAL #4   Title increase ER/IR actively to 60 degrees    Time 12   Period Weeks   Status New  Plan - 01/12/16 1610    Clinical Impression Statement pt still with difficulty relaxing and remains guarded. pt is at ROM limits per prot. Large muscle spasm/trigger point lateral delt   PT Next Visit Plan MD note sent with pt,PLEASE advise if different from protocol being followed      Patient will benefit from skilled therapeutic intervention in order to improve the following deficits and impairments:     Visit Diagnosis: Pain in left shoulder  Stiffness of left shoulder, not elsewhere classified  Localized swelling, mass and lump, left upper limb     Problem List There are no active problems to display for this patient.   PAYSEUR,ANGIE PTA 01/12/2016, 8:35 AM  [facility] [address] [place], [place], [address] Phone: [phone]   Fax:  [phone]  Name: ANISTEN TOMASSI MRN: [id number] Date of Birth: 1965/11/23

## 2016-01-22 ENCOUNTER — Ambulatory Visit: Payer: Managed Care, Other (non HMO) | Admitting: Physical Therapy

## 2016-01-22 ENCOUNTER — Encounter: Payer: Self-pay | Admitting: Physical Therapy

## 2016-01-22 DIAGNOSIS — R2232 Localized swelling, mass and lump, left upper limb: Secondary | ICD-10-CM

## 2016-01-22 DIAGNOSIS — M25512 Pain in left shoulder: Secondary | ICD-10-CM

## 2016-01-22 DIAGNOSIS — M25612 Stiffness of left shoulder, not elsewhere classified: Secondary | ICD-10-CM

## 2016-01-22 NOTE — Therapy (Signed)
[facility] [address] [place], [place], [address] Phone: [phone]   Fax:  [phone]  Physical Therapy Treatment  Patient Details  Name: Meredith Taylor MRN: [id number] Date of Birth: 11/03/1965 Referring Provider: RA collins  Encounter Date: 01/22/2016      PT End of Session - 01/22/16 1018    Visit Number 6   Date for PT Re-Evaluation 02/22/16   PT Start Time 0928   PT Stop Time 1028   PT Time Calculation (min) 60 min   Activity Tolerance Patient tolerated treatment well   Behavior During Therapy [facility] for tasks assessed/performed      History reviewed. No pertinent past medical history.  History reviewed. No pertinent past surgical history.  There were no vitals filed for this visit.      Subjective Assessment - 01/22/16 0927    Subjective "Its ok"   Currently in Pain? Yes   Pain Score 1    Pain Location Shoulder   Pain Orientation Left;Lateral                         [facility] Adult PT Treatment/Exercise - 01/22/16 0001    Shoulder Exercises: Seated   Other Seated Exercises NuStep L1 x5 min   UE not doing any work   Shoulder Exercises: Standing   External Rotation Strengthening;Left;15 reps;Theraband  x2   Theraband Level (Shoulder External Rotation) Level 1 (Yellow)   Internal Rotation Strengthening;Left;15 reps;Theraband  x2   Theraband Level (Shoulder Internal Rotation) Level 1 (Yellow)   Other Standing Exercises finger ladder 10 times flex to 90 and abd to 60   Other Standing Exercises wall slides flex to 90 and abd to 60 15 times each; Wall circles CW/CCW x15    Electrical Stimulation   Electrical Stimulation Location left shoulder more on the deltoid tofday   Electrical Stimulation Action IFC   Ultrasound   Ultrasound Location L sshoulder delt   Ultrasound Parameters same as 01/12/16   Vasopneumatic   Number Minutes Vasopneumatic  15 minutes   Vasopnuematic Location   Shoulder   Vasopneumatic Pressure Medium   Vasopneumatic Temperature  40   Manual Therapy   Manual Therapy Passive ROM;Soft tissue mobilization   Passive ROM Lt shoulder; flex to 90, ABD/scaption to 60, ER to 30 and IR to ~45 in 60 scaption                  PT Short Term Goals - 12/29/15 0848    PT SHORT TERM GOAL #1   Title independent with initial HEP   Status Achieved           PT Long Term Goals - 12/23/15 1641    PT LONG TERM GOAL #1   Title independent with RICE   Time 8   Period Weeks   Status New   PT LONG TERM GOAL #2   Title able to dress and do hair without difficulty   Time 12   Period Weeks   Status New   PT LONG TERM GOAL #3   Title increase AROM of shoulder flexion to WFL's   Time 12   Period Weeks   Status New   PT LONG TERM GOAL #4   Title increase ER/IR actively to 60 degrees    Time 12   Period Weeks   Status New  Plan - 01/22/16 1019    Clinical Impression Statement Pt able to complete all interventions well,  all exercises performed within protocol ROM specifics. Pt able to relax a little with MT and reports no pain    Rehab Potential Good   PT Frequency 2x / week   PT Duration 8 weeks   PT Treatment/Interventions ADLs/Self Care Home Management;Cryotherapy;Civil engineer, contracting;Therapeutic activities;Therapeutic exercise;Patient/family education;Manual techniques   PT Next Visit Plan Follow protocol      Patient will benefit from skilled therapeutic intervention in order to improve the following deficits and impairments:  Decreased range of motion, Increased edema, Impaired flexibility, Increased muscle spasms, Impaired UE functional use, Postural dysfunction, Improper body mechanics, Pain  Visit Diagnosis: Pain in left shoulder  Localized swelling, mass and lump, left upper limb  Stiffness of left shoulder, not elsewhere classified     Problem List There are no active problems  to display for this patient.   Grayce Sessions, PTA  01/22/2016, 10:27 AM  [facility] [place] [facility] [address] [place], [place], [address] Phone: [phone]   Fax:  [phone]  Name: Meredith Taylor MRN: [id number] Date of Birth: [birth year]

## 2016-02-04 ENCOUNTER — Encounter: Payer: Self-pay | Admitting: Rehabilitative and Restorative Service Providers"

## 2016-02-04 ENCOUNTER — Ambulatory Visit
Payer: Managed Care, Other (non HMO) | Attending: Specialist | Admitting: Rehabilitative and Restorative Service Providers"

## 2016-02-04 DIAGNOSIS — R2232 Localized swelling, mass and lump, left upper limb: Secondary | ICD-10-CM

## 2016-02-04 DIAGNOSIS — M25512 Pain in left shoulder: Secondary | ICD-10-CM | POA: Insufficient documentation

## 2016-02-04 DIAGNOSIS — M25612 Stiffness of left shoulder, not elsewhere classified: Secondary | ICD-10-CM

## 2016-02-04 NOTE — Therapy (Signed)
[facility] [address]  [address] [place], [place], [address] Phone: [phone]   Fax:  [phone]  Physical Therapy Treatment  Patient Details  Name: Meredith Taylor MRN: [id number] Date of Birth: 1966-01-19 Referring Provider: Dr Jeni Salles  Encounter Date: 02/04/2016      PT End of Session - 02/04/16 0713    Visit Number 7   Date for PT Re-Evaluation 03/16/16   PT Start Time 0711   PT Stop Time 0800   PT Time Calculation (min) 49 min   Activity Tolerance Patient tolerated treatment well      History reviewed. No pertinent past medical history.  History reviewed. No pertinent past surgical history.  There were no vitals filed for this visit.      Subjective Assessment - 02/04/16 0714    Subjective MD felt patient would benefit from TDN for muscular tightness through the Lt deltoid and shoulder.    Currently in Pain? Yes   Pain Score 2    Pain Location Shoulder   Pain Orientation Left;Lateral   Pain Descriptors / Indicators Aching;Tightness            [facility] PT Assessment - 02/04/16 0001    Assessment   Medical Diagnosis S/P left labral repair   Referring Provider Dr Jeni Salles   Onset Date/Surgical Date 12/08/15   Hand Dominance Right   Next MD Visit 02/12/16   Palpation   Palpation comment tightness through the pecs; traps; leveator; teres; biceps; deltoid Lt UE                      [facility] Adult PT Treatment/Exercise - 02/04/16 0001    Therapeutic Activites    Therapeutic Activities --  instruct in myofacial ball release work    Shoulder Exercises: Standing   Retraction --  scap squeeze w/noodle 10secx 10   Moist Heat Therapy   Number Minutes Moist Heat 15 Minutes   Moist Heat Location Shoulder  Lt ant/post    Manual Therapy   Manual Therapy Soft tissue mobilization   Soft tissue mobilization upper trap; leveator; pecs; teres; biceps; deltoid Lt          Trigger Point Dry  Needling - 02/04/16 0813    Muscles Treated Upper Body --  deltoid   Upper Trapezius Response Twitch reponse elicited;Palpable increased muscle length   Pectoralis Major Response Twitch response elicited;Palpable increased muscle length   Pectoralis Minor Response Twitch response elicited;Palpable increased muscle length   Levator Scapulae Response Twitch response elicited;Palpable increased muscle length   Supraspinatus Response Twitch response elicited;Palpable increased muscle length   Infraspinatus Response Twitch response elicited;Palpable increased muscle length              PT Education - 02/04/16 0715    Education provided Yes   Education Details TDN; HEP   Person(s) Educated Patient   Methods Explanation;Demonstration;Tactile cues;Verbal cues;Handout   Comprehension Verbalized understanding;Returned demonstration;Verbal cues required;Tactile cues required          PT Short Term Goals - 12/29/15 0848    PT SHORT TERM GOAL #1   Title independent with initial HEP   Status Achieved           PT Long Term Goals - 02/04/16 8413    PT LONG TERM GOAL #1   Title independent with RICE   Time 8   Period Weeks   Status On-going   PT LONG TERM GOAL #  2   Title able to dress and do hair without difficulty   Time 12   Period Weeks   Status On-going   PT LONG TERM GOAL #3   Title increase AROM of shoulder flexion to WFL's   Time 12   Period Weeks   Status On-going   PT LONG TERM GOAL #4   Title increase ER/IR actively to 60 degrees    Time 12   Period Weeks   Status On-going               Plan - 02/04/16 [id number]    Clinical Impression Statement Significant muscular tightness through Lt upper quarter. Responded well to TDN with decreased tightness to palpation.    Rehab Potential Good   PT Frequency 2x / week   PT Duration 8 weeks   PT Treatment/Interventions ADLs/Self Care Home Management;Cryotherapy;Herbalistlectrical Stimulation;Vasopneumatic  Device;Therapeutic activities;Therapeutic exercise;Patient/family education;Manual techniques;Neuromuscular re-education;Dry needling;Moist Heat;Ultrasound   PT Next Visit Plan Shoulder rehab per protocol; TDN as indicated   Consulted and Agree with Plan of Care Patient      Patient will benefit from skilled therapeutic intervention in order to improve the following deficits and impairments:  Decreased range of motion, Increased edema, Impaired flexibility, Increased muscle spasms, Impaired UE functional use, Postural dysfunction, Improper body mechanics, Pain  Visit Diagnosis: Pain in left shoulder - Plan: PT plan of care cert/re-cert  Localized swelling, mass and lump, left upper limb - Plan: PT plan of care cert/re-cert  Stiffness of left shoulder, not elsewhere classified - Plan: PT plan of care cert/re-cert     Problem List There are no active problems to display for this patient.   Celyn Rober MinionP Holt PT, MPH  02/04/2016, 8:26 AM  [facility] [place] [address]  [address] [place], [place], [address] Phone: [phone]   Fax:  [phone]  Name: Haze JustinCarrie K Milliner MRN: [id number] Date of Birth: September 10, 1965

## 2016-02-04 NOTE — Patient Instructions (Addendum)
Trigger Point Dry Needling  . What is Trigger Point Dry Needling (DN)? o DN is a physical therapy technique used to treat muscle pain and dysfunction. Specifically, DN helps deactivate muscle trigger points (muscle knots).  o A thin filiform needle is used to penetrate the skin and stimulate the underlying trigger point. The goal is for a local twitch response (LTR) to occur and for the trigger point to relax. No medication of any kind is injected during the procedure.   . What Does Trigger Point Dry Needling Feel Like?  o The procedure feels different for each individual patient. Some patients report that they do not actually feel the needle enter the skin and overall the process is not painful. Very mild bleeding may occur. However, many patients feel a deep cramping in the muscle in which the needle was inserted. This is the local twitch response.   Marland Kitchen. How Will I feel after the treatment? o Soreness is normal, and the onset of soreness may not occur for a few hours. Typically this soreness does not last longer than two days.  o Bruising is uncommon, however; ice can be used to decrease any possible bruising.  o In rare cases feeling tired or nauseous after the treatment is normal. In addition, your symptoms may get worse before they get better, this period will typically not last longer than 24 hours.   . What Can I do After My Treatment? o Increase your hydration by drinking more water for the next 24 hours. o You may place ice or heat on the areas treated that have become sore, however, do not use heat on inflamed or bruised areas. Heat often brings more relief post needling. o You can continue your regular activities, but vigorous activity is not recommended initially after the treatment for 24 hours. o DN is best combined with other physical therapy such as strengthening, stretching, and other therapies.    Shoulder Blade Squeeze   Can use swim noodle  Rotate shoulders back, then  squeeze shoulder blades down and back Hold 10 sec Repeat _10___ times. Do __several__ sessions per day.   Self massage using ~4 inch rubber ball   TENS UNIT: This is helpful for muscle pain and spasm.   Search and Purchase a TENS 7000 2nd edition at www.tenspros.com. It should be less than $30.     TENS unit instructions: Do not shower or bathe with the unit on Turn the unit off before removing electrodes or batteries If the electrodes lose stickiness add a drop of water to the electrodes after they are disconnected from the unit and place on plastic sheet. If you continued to have difficulty, call the TENS unit company to purchase more electrodes. Do not apply lotion on the skin area prior to use. Make sure the skin is clean and dry as this will help prolong the life of the electrodes. After use, always check skin for unusual red areas, rash or other skin difficulties. If there are any skin problems, does not apply electrodes to the same area. Never remove the electrodes from the unit by pulling the wires. Do not use the TENS unit or electrodes other than as directed. Do not change electrode placement without consultating your therapist or physician. Keep 2 fingers with between each electrode.

## 2016-02-05 ENCOUNTER — Encounter: Payer: Self-pay | Admitting: *Deleted

## 2016-02-05 ENCOUNTER — Ambulatory Visit: Payer: Managed Care, Other (non HMO) | Admitting: Physical Therapy

## 2016-02-05 ENCOUNTER — Encounter: Payer: Self-pay | Admitting: Physical Therapy

## 2016-02-05 DIAGNOSIS — M25512 Pain in left shoulder: Secondary | ICD-10-CM | POA: Diagnosis not present

## 2016-02-05 DIAGNOSIS — M25612 Stiffness of left shoulder, not elsewhere classified: Secondary | ICD-10-CM

## 2016-02-05 DIAGNOSIS — R2232 Localized swelling, mass and lump, left upper limb: Secondary | ICD-10-CM

## 2016-02-05 NOTE — Therapy (Signed)
[facility] [address] [place], [place], [address] Phone: [phone]   Fax:  [phone]  Physical Therapy Treatment  Patient Details  Name: Meredith Taylor MRN: [id number] Date of Birth: 1965-10-22 Referring Provider: Dr Jeni SallesA Collins  Encounter Date: 02/05/2016      PT End of Session - 02/05/16 1005    Visit Number 8   Date for PT Re-Evaluation 03/16/16   PT Start Time 0922   PT Stop Time 1019   PT Time Calculation (min) 57 min   Activity Tolerance Patient tolerated treatment well   Behavior [facility] tasks assessed/performed      Past Medical History  Diagnosis Date  . Nasal obstruction     nasal valve collapse  . Rhinitis     allergic  . Tonsillitis, chronic     Past Surgical History  Procedure Laterality Date  . Abdominal hysterectomy      There were no vitals filed for this visit.      Subjective Assessment - 02/05/16 0933    Subjective Patient reports that she had dry needling yesterday and is very sore today, but reports that she wsa extremely sore last night   Currently in Pain? Yes   Pain Score 4    Pain Location Shoulder   Pain Orientation Left;Lateral   Aggravating Factors  the needling increased my soreness but I think it will help   Pain Relieving Factors ice            [facility] PT Assessment - 02/05/16 0001    ROM / Strength   AROM / PROM / Strength AROM   AROM   AROM Assessment Site Shoulder   Right/Left Shoulder Left   Left Shoulder Flexion 118 Degrees   Left Shoulder Internal Rotation 40 Degrees   Left Shoulder External Rotation 60 Degrees   PROM   Left Shoulder Flexion 140 Degrees   Left Shoulder Internal Rotation 60 Degrees   Left Shoulder External Rotation 70 Degrees                     [facility] Adult PT Treatment/Exercise - 02/05/16 0001    Shoulder Exercises: ROM/Strengthening   Other ROM/Strengthening Exercises red tband scapular retractions  and ER also gave for HEP at home, with star gazer and AAROM flexion stretches   Electrical Stimulation   Electrical Stimulation Location left shoulder   Electrical Stimulation Action IFC   Electrical Stimulation Parameters sitting   Electrical Stimulation Goals Pain   Manual Therapy   Passive ROM left shoulder All ROM with some joint distraction and inferior GH glides as well as caudal distractions          Trigger Point Dry Needling - 02/04/16 0813    Muscles Treated Upper Body --  deltoid   Upper Trapezius Response Twitch reponse elicited;Palpable increased muscle length   Pectoralis Major Response Twitch response elicited;Palpable increased muscle length   Pectoralis Minor Response Twitch response elicited;Palpable increased muscle length   Levator Scapulae Response Twitch response elicited;Palpable increased muscle length   Supraspinatus Response Twitch response elicited;Palpable increased muscle length   Infraspinatus Response Twitch response elicited;Palpable increased muscle length              PT Education - 02/05/16 1005    Education provided Yes   Education Details HEP red tband ER and scapular stabilization, star gazer and AAROM flexion stretches   Person(s) Educated Patient   Methods  Explanation;Demonstration;Handout   Comprehension Verbalized understanding;Returned demonstration          PT Short Term Goals - 12/29/15 0848    PT SHORT TERM GOAL #1   Title independent with initial HEP   Status Achieved           PT Long Term Goals - 02/05/16 1006    PT LONG TERM GOAL #1   Title independent with RICE   Status Achieved               Plan - 02/05/16 1006    Clinical Impression Statement She c/o increased soreness today after TDN yesterday, however her ROM is much improved over the past month and she seems to be able to allow more PROM   PT Next Visit Plan Shoulder rehab per protocol; TDN as indicated   Consulted and Agree with Plan of Care  Patient      Patient will benefit from skilled therapeutic intervention in order to improve the following deficits and impairments:  Decreased range of motion, Increased edema, Impaired flexibility, Increased muscle spasms, Impaired UE functional use, Postural dysfunction, Improper body mechanics, Pain  Visit Diagnosis: Pain in left shoulder  Localized swelling, mass and lump, left upper limb  Stiffness of left shoulder, not elsewhere classified     Problem List There are no active problems to display for this patient.   Jearld Lesch., PT 02/05/2016, 10:07 AM  [facility] [address] [place] [address] [place], [place], [address] Phone: [phone]   Fax:  [phone]  Name: Meredith Taylor MRN: [id number] Date of Birth: 1965/09/24

## 2016-02-11 ENCOUNTER — Encounter: Payer: Self-pay | Admitting: Rehabilitative and Restorative Service Providers"

## 2016-02-11 ENCOUNTER — Ambulatory Visit: Payer: Managed Care, Other (non HMO) | Admitting: Rehabilitative and Restorative Service Providers"

## 2016-02-11 DIAGNOSIS — M25512 Pain in left shoulder: Secondary | ICD-10-CM

## 2016-02-11 DIAGNOSIS — M25612 Stiffness of left shoulder, not elsewhere classified: Secondary | ICD-10-CM

## 2016-02-11 DIAGNOSIS — R2232 Localized swelling, mass and lump, left upper limb: Secondary | ICD-10-CM

## 2016-02-11 NOTE — Therapy (Signed)
[facility] [address]  [address] [place], [place], [address] Phone: [phone]   Fax:  [phone]  Physical Therapy Treatment  Patient Details  Name: Meredith Taylor MRN: [id number] Date of Birth: October 22, 1965 Referring Provider: Dr Jeni Salles  Encounter Date: 02/11/2016      PT End of Session - 02/11/16 0713    Visit Number 9   Date for PT Re-Evaluation 03/16/16   PT Start Time 0713   PT Stop Time 0802   PT Time Calculation (min) 49 min   Activity Tolerance Patient tolerated treatment well      Past Medical History  Diagnosis Date  . Nasal obstruction     nasal valve collapse  . Rhinitis     allergic  . Tonsillitis, chronic   . Environmental allergies   . PONV (postoperative nausea and vomiting)     Past Surgical History  Procedure Laterality Date  . Abdominal hysterectomy    . Shoulder surgery Left 4/17    [facility]  . Back surgery  2010. 12/16    herniated disc.  Dr. Lovell Sheehan    There were no vitals filed for this visit.      Subjective Assessment - 02/11/16 0713    Subjective Not sure if the TDN helped but thinks it may have. She sees PT again the in the morning and the MD tomorrow afternoon.    Currently in Pain? Yes   Pain Score 2    Pain Location Shoulder   Pain Orientation Left;Lateral   Pain Descriptors / Indicators Aching;Tightness                         [facility] Adult PT Treatment/Exercise - 02/11/16 0001    Shoulder Exercises: Supine   Other Supine Exercises neural mobilization supine 1 min x 1; prolonged stretch supine snow angle position    Moist Heat Therapy   Number Minutes Moist Heat 15 Minutes   Moist Heat Location Shoulder  Lt ant/post   Electrical Stimulation   Electrical Stimulation Location left shoulder   Electrical Stimulation Action IFC   Electrical Stimulation Parameters to tolerance   Electrical Stimulation Goals Pain;Tone  musclular tightness     Manual Therapy   Manual Therapy Soft tissue mobilization   Soft tissue mobilization upper trap; leveator; pecs; teres; biceps; deltoid Lt          Trigger Point Dry Needling - 02/11/16 0800    Consent Given? Yes   Upper Trapezius Response Twitch reponse elicited;Palpable increased muscle length   Pectoralis Major Response Twitch response elicited;Palpable increased muscle length   Pectoralis Minor Response Twitch response elicited;Palpable increased muscle length   Levator Scapulae Response Twitch response elicited;Palpable increased muscle length   Supraspinatus Response Twitch response elicited;Palpable increased muscle length   Infraspinatus Response Twitch response elicited;Palpable increased muscle length              PT Education - 02/11/16 0807    Education provided Yes   Education Details HEP    Person(s) Educated Patient   Methods Explanation;Demonstration;Tactile cues;Verbal cues;Handout   Comprehension Verbalized understanding;Returned demonstration;Verbal cues required;Tactile cues required          PT Short Term Goals - 12/29/15 0848    PT SHORT TERM GOAL #1   Title independent with initial HEP   Status Achieved           PT Long Term Goals - 02/05/16 1006  PT LONG TERM GOAL #1   Title independent with RICE   Status Achieved               Plan - 02/11/16 0807    Clinical Impression Statement Decreased palpable tightness noted through Lt shoulder girdle compared to last week. Some bruising and soreness following initial TDN. Tolerated TDN well today.    Rehab Potential Good   PT Frequency 2x / week   PT Duration 8 weeks   PT Treatment/Interventions ADLs/Self Care Home Management;Cryotherapy;Civil engineer, contractinglectrical Stimulation;Vasopneumatic Device;Therapeutic activities;Therapeutic exercise;Patient/family education;Manual techniques;Neuromuscular re-education;Dry needling;Moist Heat;Ultrasound   PT Next Visit Plan Shoulder rehab per protocol; TDN  as indicated   Consulted and Agree with Plan of Care Patient      Patient will benefit from skilled therapeutic intervention in order to improve the following deficits and impairments:  Decreased range of motion, Increased edema, Impaired flexibility, Increased muscle spasms, Impaired UE functional use, Postural dysfunction, Improper body mechanics, Pain  Visit Diagnosis: Pain in left shoulder  Localized swelling, mass and lump, left upper limb  Stiffness of left shoulder, not elsewhere classified     Problem List There are no active problems to display for this patient.   Garyson Stelly Rober MinionP Kayleana Waites PT, MPH  02/11/2016, 8:10 AM  [facility] [place] [address]  [address] [place], [place], [address] Phone: [phone]   Fax:  [phone]  Name: Meredith Taylor MRN: [id number] Date of Birth: [birth year]

## 2016-02-11 NOTE — Patient Instructions (Signed)
Neurovascular: Median Nerve Stretch - Supine    Lie with neck supported, side-bent away from moving arm. Hold right arm out to side, elbow bent, thumb down, fingers and wrist bent back. Slowly straighten elbow without pain.  Hold for __60__ seconds. Repeat __2__ times per set.  Do __2 times/day  [facility] [place]: Double Arm    Lying on back arms at side in comfortable position  Work to lie "relaxed" for 3-5 min  Can bend elbows to release the stretch  Gradually work arms up to 90 to 120 degrees for stretch

## 2016-02-12 ENCOUNTER — Ambulatory Visit: Payer: Managed Care, Other (non HMO) | Admitting: Physical Therapy

## 2016-02-12 ENCOUNTER — Encounter: Payer: Self-pay | Admitting: Physical Therapy

## 2016-02-12 DIAGNOSIS — M25512 Pain in left shoulder: Secondary | ICD-10-CM

## 2016-02-12 DIAGNOSIS — M25612 Stiffness of left shoulder, not elsewhere classified: Secondary | ICD-10-CM

## 2016-02-12 DIAGNOSIS — R2232 Localized swelling, mass and lump, left upper limb: Secondary | ICD-10-CM

## 2016-02-12 NOTE — Therapy (Signed)
[place], [place], [address] Phone: [phone]   Fax:  [phone]  Physical Therapy Treatment  Patient Details  Name: ARIDAY BRINKER MRN: [id number] Date of Birth: Jul 23, 1966 Referring Provider: Dr Jomarie Longs  Encounter Date: 02/12/2016      PT End of Session - 02/12/16 0839    Visit Number 10   Date for PT Re-Evaluation 03/16/16   PT Start Time 0800   PT Stop Time 0856   PT Time Calculation (min) 56 min   Activity Tolerance Patient tolerated treatment well   Behavior During Therapy [facility] for tasks assessed/performed      Past Medical History  Diagnosis Date  . Nasal obstruction     nasal valve collapse  . Rhinitis     allergic  . Tonsillitis, chronic   . Environmental allergies   . PONV (postoperative nausea and vomiting)     Past Surgical History  Procedure Laterality Date  . Abdominal hysterectomy    . Shoulder surgery Left 4/17    [place]  . Back surgery  2010. 12/16    herniated disc.  Dr. Arnoldo Morale    There were no vitals filed for this visit.      Subjective Assessment - 02/12/16 0759    Subjective Patient has had dry needling 2x, she is unsure if it is helping but she seems to be more relaxed   Limitations Reading   Currently in Pain? No/denies            [facility] PT Assessment - 02/12/16 0001    AROM   Left Shoulder Flexion 130 Degrees   Left Shoulder ABduction 110 Degrees   Left Shoulder Internal Rotation 45 Degrees   Left Shoulder External Rotation 66 Degrees   Palpation   Palpation comment less tightness and gaurding with PROM                     [facility] Adult PT Treatment/Exercise - 02/12/16 0001    Shoulder Exercises: Standing   Other Standing Exercises wand exercises all motions   Other Standing Exercises wall slides and circles, ball approximation on wall with small circles   Shoulder Exercises: Therapy Ball   Other Therapy Ball  Exercises ball rolling on mat and on wall   Shoulder Exercises: Psychologist, sport and exercise 10 seconds;4 reps   Acupuncturist Location left shoulder   Electrical Stimulation Action IFC   Electrical Stimulation Parameters sitting   Electrical Stimulation Goals Pain   Vasopneumatic   Number Minutes Vasopneumatic  15 minutes   Vasopnuematic Location  Shoulder   Vasopneumatic Pressure Medium   Vasopneumatic Temperature  40   Manual Therapy   Passive ROM PROM of the left shoulder, gentle ossilations, Grade II/III joint mobs of the [facility] joint          Trigger Point Dry Needling - 02/11/16 0800    Consent Given? Yes   Upper Trapezius Response Twitch reponse elicited;Palpable increased muscle length   Pectoralis Major Response Twitch response elicited;Palpable increased muscle length   Pectoralis Minor Response Twitch response elicited;Palpable increased muscle length   Levator Scapulae Response Twitch response elicited;Palpable increased muscle length   Supraspinatus Response Twitch response elicited;Palpable increased muscle length   Infraspinatus Response Twitch response elicited;Palpable increased muscle length              PT Education - 02/11/16 3818  Education provided Yes   Education Details HEP    Person(s) Educated Patient   Methods Explanation;Demonstration;Tactile cues;Verbal cues;Handout   Comprehension Verbalized understanding;Returned demonstration;Verbal cues required;Tactile cues required          PT Short Term Goals - 12/29/15 0848    PT SHORT TERM GOAL #1   Title independent with initial HEP   Status Achieved           PT Long Term Goals - 02/12/16 0841    PT LONG TERM GOAL #1   Title independent with RICE   Status Achieved   PT LONG TERM GOAL #2   Title able to dress and do hair without difficulty   Status Partially Met   PT LONG TERM GOAL #3   Title increase AROM of shoulder flexion to WFL's   Status  On-going   PT LONG TERM GOAL #4   Title increase ER/IR actively to 60 degrees    Status On-going               Plan - 02/12/16 0839    Clinical Impression Statement Mrs. Mittal has less gaurding and tightness in the deltoid, she is sore from the dry needling and has bruising.  She has gained a lot of ROM over the past two weeks.  Reports less difficulty with ADL's.   PT Next Visit Plan We are following MD protocol and will progress to week 10 next week.  Unless otherwise directed   Consulted and Agree with Plan of Care Patient      Patient will benefit from skilled therapeutic intervention in order to improve the following deficits and impairments:  Decreased range of motion, Increased edema, Impaired flexibility, Increased muscle spasms, Impaired UE functional use, Postural dysfunction, Improper body mechanics, Pain  Visit Diagnosis: Pain in left shoulder  Localized swelling, mass and lump, left upper limb  Stiffness of left shoulder, not elsewhere classified     Problem List There are no active problems to display for this patient.   Sumner Boast., PT 02/12/2016, 8:42 AM  [place], [place], [address] Phone: [phone]   Fax:  [phone]  Name: AZALYA GALYON MRN: [id number] Date of Birth: [birth year]

## 2016-02-17 NOTE — Discharge Instructions (Signed)
T & A INSTRUCTION SHEET - [facility] °[facility] ° °CREIGHTON VAUGHT, MD °PAUL H. JUENGEL, MD  °P. SCOTT BENNETT °CHAPMAN MCQUEEN, MD ° °[address] [place],  [address] TEL. [phone] °[address] [place] [address] [phone] ° °INFORMATION SHEET FOR A TONSILLECTOMY AND ADENDOIDECTOMY ° °About Your Tonsils and Adenoids ° The tonsils and adenoids are normal body tissues that are part of our immune system.  They normally help to protect us against diseases that may enter our mouth and nose.  However, sometimes the tonsils and/or adenoids become too large and obstruct our breathing, especially at night. °  ° If either of these things happen it helps to remove the tonsils and adenoids in order to become healthier. The operation to remove the tonsils and adenoids is called a tonsillectomy and adenoidectomy. ° °The Location of Your Tonsils and Adenoids ° The tonsils are located in the back of the throat on both side and sit in a cradle of muscles. The adenoids are located in the roof of the mouth, behind the nose, and closely associated with the opening of the Eustachian tube to the ear. ° °Surgery on Tonsils and Adenoids ° A tonsillectomy and adenoidectomy is a short operation which takes about thirty minutes.  This includes being put to sleep and being awakened.  Tonsillectomies and adenoidectomies are performed at [facility] and may require observation period in the recovery room prior to going home. ° °Following the Operation for a Tonsillectomy ° A cautery machine is used to control bleeding.  Bleeding from a tonsillectomy and adenoidectomy is minimal and postoperatively the risk of bleeding is approximately four percent, although this rarely life threatening. ° ° ° °After your tonsillectomy and adenoidectomy post-op care at home: ° °1. Our patients are able to go home the same day.  You may be given prescriptions for pain  medications and antibiotics, if indicated. °2. It is extremely important to remember that fluid intake is of utmost importance after a tonsillectomy.  The amount that you drink must be maintained in the postoperative period.  A good indication of whether a child is getting enough fluid is whether his/her urine output is constant.  As long as children are urinating or wetting their diaper every 6 - 8 hours this is usually enough fluid intake.   °3. Although rare, this is a risk of some bleeding in the first ten days after surgery.  This is usually occurs between day five and nine postoperatively.  This risk of bleeding is approximately four percent.  If you or your child should have any bleeding you should remain calm and notify our office or go directly to the Emergency Room at [facility] where they will contact us. Our doctors are available seven days a week for notification.  We recommend sitting up quietly in a chair, place an ice pack on the front of the neck and spitting out the blood gently until we are able to contact you.  Adults should gargle gently with ice water and this may help stop the bleeding.  If the bleeding does not stop after a short time, i.e. 10 to 15 minutes, or seems to be increasing again, please contact us or go to the hospital.   °4. It is common for the pain to be worse at 5 - 7 days postoperatively.  This occurs because the “scab” is peeling off and the mucous membrane (skin of   the throat) is growing back where the tonsils were.   °5. It is common for a low-grade fever, less than 102, during the first week after a tonsillectomy and adenoidectomy.  It is usually due to not drinking enough liquids, and we suggest your use liquid Tylenol or the pain medicine with Tylenol prescribed in order to keep your temperature below 102.  Please follow the directions on the back of the bottle. °6. Do not take aspirin or any products that contain aspirin such as Bufferin, Anacin,  Ecotrin, aspirin gum, Goodies, BC headache powders, etc., after a T&A because it can promote bleeding.  Please check with our office before administering any other medication that may been prescribed by other doctors during the two week post-operative period. °7. If you happen to look in the mirror or into your child’s mouth you will see white/gray patches on the back of the throat.  This is what a scab looks like in the mouth and is normal after having a T&A.  It will disappear once the tonsil area heals completely. However, it may cause a noticeable odor, and this too will disappear with time.     °8. You or your child may experience ear pain after having a T&A.  This is called referred pain and comes from the throat, but it is felt in the ears.  Ear pain is quite common and expected.  It will usually go away after ten days.  There is usually nothing wrong with the ears, and it is primarily due to the healing area stimulating the nerve to the ear that runs along the side of the throat.  Use either the prescribed pain medicine or Tylenol as needed.  °9. The throat tissues after a tonsillectomy are obviously sensitive.  Smoking around children who have had a tonsillectomy significantly increases the risk of bleeding.  DO NOT SMOKE!  ° °General Anesthesia, Adult, Care After °Refer to this sheet in the next few weeks. These instructions provide you with information on caring for yourself after your procedure. Your health care provider may also give you more specific instructions. Your treatment has been planned according to current medical practices, but problems sometimes occur. Call your health care provider if you have any problems or questions after your procedure. °WHAT TO EXPECT AFTER THE PROCEDURE °After the procedure, it is typical to experience: °· Sleepiness. °· Nausea and vomiting. °HOME CARE INSTRUCTIONS °· For the first 24 hours after general anesthesia: °¨ Have a responsible person with you. °¨ Do not  drive a car. If you are alone, do not take public transportation. °¨ Do not drink alcohol. °¨ Do not take medicine that has not been prescribed by your health care provider. °¨ Do not sign important papers or make important decisions. °¨ You may resume a normal diet and activities as directed by your health care provider. °· Change bandages (dressings) as directed. °· If you have questions or problems that seem related to general anesthesia, call the hospital and ask for the anesthetist or anesthesiologist on call. °SEEK MEDICAL CARE IF: °· You have nausea and vomiting that continue the day after anesthesia. °· You develop a rash. °SEEK IMMEDIATE MEDICAL CARE IF:  °· You have difficulty breathing. °· You have chest pain. °· You have any allergic problems. °  °This information is not intended to replace advice given to you by your health care provider. Make sure you discuss any questions you have with your health care provider. °  °Document   Released: 11/21/2000 Document Revised: 09/05/2014 Document Reviewed: 12/14/2011 °Elsevier Interactive Patient Education ©2016 [employer] ° °

## 2016-02-18 ENCOUNTER — Ambulatory Visit
Admission: RE | Admit: 2016-02-18 | Discharge: 2016-02-18 | Disposition: A | Discharge status: Home / Self Care | Payer: Managed Care, Other (non HMO) | Source: Ambulatory Visit | Attending: Otolaryngology | Admitting: Otolaryngology

## 2016-02-18 ENCOUNTER — Ambulatory Visit: Payer: Managed Care, Other (non HMO) | Admitting: Anesthesiology

## 2016-02-18 ENCOUNTER — Encounter
Admission: RE | Disposition: A | Discharge status: Home / Self Care | Payer: Self-pay | Source: Ambulatory Visit | Attending: Otolaryngology

## 2016-02-18 DIAGNOSIS — J3489 Other specified disorders of nose and nasal sinuses: Secondary | ICD-10-CM | POA: Insufficient documentation

## 2016-02-18 DIAGNOSIS — J3501 Chronic tonsillitis: Secondary | ICD-10-CM | POA: Diagnosis not present

## 2016-02-18 DIAGNOSIS — M950 Acquired deformity of nose: Secondary | ICD-10-CM | POA: Diagnosis not present

## 2016-02-18 HISTORY — DX: Other specified disorders of nose and nasal sinuses: J34.89

## 2016-02-18 HISTORY — DX: Other specified postprocedural states: Z98.890

## 2016-02-18 HISTORY — PX: TONSILLECTOMY: SHX5217

## 2016-02-18 HISTORY — DX: Other specified postprocedural states: R11.2

## 2016-02-18 HISTORY — DX: Chronic rhinitis: J31.0

## 2016-02-18 HISTORY — DX: Chronic tonsillitis: J35.01

## 2016-02-18 HISTORY — DX: Other allergy status, other than to drugs and biological substances: Z91.09

## 2016-02-18 SURGERY — TONSILLECTOMY
Anesthesia: General | Site: Throat | Laterality: Bilateral | Wound class: Clean Contaminated

## 2016-02-18 MED ORDER — OXYCODONE HCL 5 MG/5ML PO SOLN
5.0000 mg | Freq: Once | ORAL | Status: AC | PRN
  Administered 2016-02-18: 5 mg via ORAL

## 2016-02-18 MED ORDER — FENTANYL CITRATE (PF) 100 MCG/2ML IJ SOLN
INTRAMUSCULAR | Status: DC | PRN
  Administered 2016-02-18: 100 ug via INTRAVENOUS
  Administered 2016-02-18: 25 ug via INTRAVENOUS
  Administered 2016-02-18: 50 ug via INTRAVENOUS

## 2016-02-18 MED ORDER — GLYCOPYRROLATE 0.2 MG/ML IJ SOLN
INTRAMUSCULAR | Status: DC | PRN
  Administered 2016-02-18: 0.1 mg via INTRAVENOUS

## 2016-02-18 MED ORDER — DEXAMETHASONE SODIUM PHOSPHATE 4 MG/ML IJ SOLN
INTRAMUSCULAR | Status: DC | PRN
  Administered 2016-02-18: 10 mg via INTRAVENOUS

## 2016-02-18 MED ORDER — SCOPOLAMINE 1 MG/3DAYS TD PT72
1.0000 | MEDICATED_PATCH | TRANSDERMAL | Status: DC
  Administered 2016-02-18: 1.5 mg via TRANSDERMAL

## 2016-02-18 MED ORDER — LACTATED RINGERS IV SOLN
INTRAVENOUS | Status: DC
  Administered 2016-02-18: 08:00:00 via INTRAVENOUS

## 2016-02-18 MED ORDER — LIDOCAINE-EPINEPHRINE 1 %-1:100000 IJ SOLN
INTRAMUSCULAR | Status: DC | PRN
  Administered 2016-02-18: .5 mL

## 2016-02-18 MED ORDER — HYDROMORPHONE HCL 1 MG/ML IJ SOLN
0.2500 mg | INTRAMUSCULAR | Status: DC | PRN
  Administered 2016-02-18 (×4): 0.25 mg via INTRAVENOUS

## 2016-02-18 MED ORDER — CEFAZOLIN SODIUM-DEXTROSE 2-4 GM/100ML-% IV SOLN
2.0000 g | Freq: Once | INTRAVENOUS | Status: AC
  Administered 2016-02-18: 2 g via INTRAVENOUS

## 2016-02-18 MED ORDER — FENTANYL CITRATE (PF) 100 MCG/2ML IJ SOLN: 50.0000 ug | INTRAMUSCULAR | Status: DC | PRN

## 2016-02-18 MED ORDER — OXYMETAZOLINE HCL 0.05 % NA SOLN
2.0000 | Freq: Once | NASAL | Status: AC
  Administered 2016-02-18: 2 via NASAL

## 2016-02-18 MED ORDER — MIDAZOLAM HCL 5 MG/5ML IJ SOLN
INTRAMUSCULAR | Status: DC | PRN
  Administered 2016-02-18: 2 mg via INTRAVENOUS

## 2016-02-18 MED ORDER — ACETAMINOPHEN 10 MG/ML IV SOLN
1000.0000 mg | Freq: Once | INTRAVENOUS | Status: AC
  Administered 2016-02-18: 1000 mg via INTRAVENOUS

## 2016-02-18 MED ORDER — PROPOFOL 10 MG/ML IV BOLUS
INTRAVENOUS | Status: DC | PRN
  Administered 2016-02-18: 200 mg via INTRAVENOUS

## 2016-02-18 MED ORDER — LIDOCAINE HCL (CARDIAC) 20 MG/ML IV SOLN
INTRAVENOUS | Status: DC | PRN
  Administered 2016-02-18: 50 mg via INTRAVENOUS

## 2016-02-18 MED ORDER — OXYCODONE HCL 5 MG PO TABS: 5.0000 mg | ORAL_TABLET | Freq: Once | ORAL | Status: AC | PRN

## 2016-02-18 MED ORDER — ONDANSETRON HCL 4 MG/2ML IJ SOLN
INTRAMUSCULAR | Status: DC | PRN
  Administered 2016-02-18: 4 mg via INTRAVENOUS

## 2016-02-18 SURGICAL SUPPLY — 38 items
BLADE BOVIE TIP EXT 4 (BLADE) ×3 IMPLANT
CANISTER SUCT 1200ML W/VALVE (MISCELLANEOUS) ×3 IMPLANT
CATH IV 18X1 1/4 SAFELET (CATHETERS) ×3 IMPLANT
COAG SUCT 10F 3.5MM HAND CTRL (MISCELLANEOUS) ×3 IMPLANT
COAGULATOR SUCT 8FR VV (MISCELLANEOUS) IMPLANT
DEVICE INFLATION 20/61 (MISCELLANEOUS) IMPLANT
DRAPE HEAD BAR (DRAPES) ×3 IMPLANT
DRESSING NASL FOAM PST OP SINU (MISCELLANEOUS) IMPLANT
DRSG NASAL 4CM NASOPORE (MISCELLANEOUS) IMPLANT
DRSG NASAL FOAM POST OP SINU (MISCELLANEOUS)
ELECT CAUTERY BLADE TIP 2.5 (TIP) ×3
ELECTRODE CAUTERY BLDE TIP 2.5 (TIP) ×2 IMPLANT
GLOVE PI ULTRA LF STRL 7.5 (GLOVE) ×6 IMPLANT
GLOVE PI ULTRA NON LATEX 7.5 (GLOVE) ×3
IMPLANT NASAL LATERA ABSORABLE (Miscellaneous) ×3 IMPLANT
IRRIGATOR 4MM STR (IRRIGATION / IRRIGATOR) IMPLANT
IV CATH 18X1 1/4 SAFELET (CATHETERS) ×2
IV NS 500ML (IV SOLUTION)
IV NS 500ML BAXH (IV SOLUTION) IMPLANT
KIT ROOM TURNOVER OR (KITS) ×3 IMPLANT
NEEDLE HYPO 25GX1X1/2 BEV (NEEDLE) ×3 IMPLANT
NEEDLE SPNL 25GX3.5 QUINCKE BL (NEEDLE) IMPLANT
NS IRRIG 500ML POUR BTL (IV SOLUTION) ×3 IMPLANT
PACK DRAPE NASAL/ENT (PACKS) ×3 IMPLANT
PACK TONSIL/ADENOIDS (PACKS) ×3 IMPLANT
PACKING NASAL EPIS 4X2.4 XEROG (MISCELLANEOUS) IMPLANT
PAD GROUND ADULT SPLIT (MISCELLANEOUS) ×3 IMPLANT
PATTIES SURGICAL .5 X3 (DISPOSABLE) ×3 IMPLANT
PENCIL ELECTRO HAND CTR (MISCELLANEOUS) ×3 IMPLANT
SET HANDPIECE IRR DIEGO (MISCELLANEOUS) IMPLANT
SINUPLASTY BALLN CATHTIP (CATHETERS) IMPLANT
SOL ANTI-FOG 6CC FOG-OUT (MISCELLANEOUS) ×2 IMPLANT
SOL FOG-OUT ANTI-FOG 6CC (MISCELLANEOUS) ×1
STRAP BODY AND KNEE 60X3 (MISCELLANEOUS) ×3 IMPLANT
SYR 3ML LL SCALE MARK (SYRINGE) ×3 IMPLANT
SYSTEM BALLN SINUPLASTY 6X16 (BALLOONS) IMPLANT
TOWEL OR 17X26 4PK STRL BLUE (TOWEL DISPOSABLE) ×3 IMPLANT
WATER STERILE IRR 500ML POUR (IV SOLUTION) IMPLANT

## 2016-02-18 NOTE — Op Note (Signed)
02/18/2016  9:23 AM    Calvin, Lyla Sonarrie  [id number]   Pre-Op Dx:  Chronic tonsillitis, dynamic nasal vestibular collapse and obstruction  Post-op Dx: Same  Proc: Repair nasal vestibular obstruction, tonsillectomy   Surg:  Logan Vegh H  Anes:  GOT  EBL:  30 mL  Comp:  None  Findings:  Very cryptic tonsils with debris  Procedure: The patient was given general anesthesia by oral endotracheal intubation. She was placed in a supine position and a Davis mouth gag was used to visualize the oropharynx. The tonsils were grasped and pulled medially. The anterior pillar incised. There is lots of debris in the tonsils that came out as the tonsil was grasped and pulled. The tonsil was dissected from its fossa with blunt dissection and electrocautery. Bleeding was controlled electrocautery. This was done on both sides. Once bleeding was fully controlled the MeggettDavis mouth gag was removed.  The nose was prepped with alcohol and markings were made on the outside of the nose where the nasal bone and the and the upper lateral cartilage started. Markings were made parallel to the nasal dorsum overlying the nasal bone and extending towards the nasal tip. This was approximately 1 cm off of midline on both sides. Once markings were done to establish the anatomy and area for dissection, a half milliliter of 1% Xylocaine with epi 1 200,000 was infiltrated on both sides using a 27-gauge needle.  Using a double hold the nasal alar was retracted and a stab incision was created at the tip of the nasal alar and dissection was carried in the subcutaneous plane overlying the lower lateral cartilage and upper lateral cartilage. Dissection plane was carried across the nasal bone overlying the periosteum but under the subcutaneous tissues. Once the tonsil was dissected the Latera implant was then placed into the dissection plane and released over the nasal bone. The cannula was removed and the implant was left in place above  the cartilage but underneath the subcutaneous tissues. This supported the implant over the bone to prevent the upper lateral cartilage from collapsing inward. Light bleeding from the insertion site that stopped of with slight pressure. This was then repeated on the opposite side in a similar fashion and mirroring the placement on the other side. Both implants could be felt in the correct position and supporting the upper lateral cartilages to prevent inward collapse and prevent the nasal vestibular stenosis.  The patient tolerated the procedure well area and she was awakened taken to the recovery room in satisfactory condition. There were no operative complications.  Dispo:   To PACU to be discharged home.  Plan:  To follow-up in the office in 1 week make sure the nasal vestibular repair is effective. She is antibiotics preventatively for the next week. She'll use pain medication as necessary for post-tonsillectomy pain. She is to not rub her nose for the next week.  Raziyah Vanvleck H  02/18/2016 9:23 AM

## 2016-02-18 NOTE — Anesthesia Procedure Notes (Signed)
Procedure Name: Intubation Date/Time: 02/18/2016 8:47 AM Performed by: Andee PolesBUSH, Nitin Mckowen Pre-anesthesia Checklist: Patient identified, Emergency Drugs available, Suction available, Patient being monitored and Timeout performed Patient Re-evaluated:Patient Re-evaluated prior to inductionOxygen Delivery Method: Circle system utilized Preoxygenation: Pre-oxygenation with 100% oxygen Intubation Type: IV induction Ventilation: Mask ventilation without difficulty Laryngoscope Size: Mac and 3 Grade View: Grade I Tube type: Oral Rae Tube size: 7.0 mm Number of attempts: 1 Placement Confirmation: ETT inserted through vocal cords under direct vision,  positive ETCO2 and breath sounds checked- equal and bilateral Tube secured with: Tape Dental Injury: Teeth and Oropharynx as per pre-operative assessment

## 2016-02-18 NOTE — Transfer of Care (Signed)
Immediate Anesthesia Transfer of Care Note  Patient: Meredith Taylor  Procedure(s) Performed: Procedure(s) with comments: TONSILLECTOMY (Bilateral) NASAL VALVE REPAIR WITH NASAL IMPLANT LATERA (Bilateral) - NASAL VALVE REPAIR  Patient Location: PACU  Anesthesia Type: General ETT  Level of Consciousness: awake, alert  and patient cooperative  Airway and Oxygen Therapy: Patient Spontanous Breathing and Patient connected to supplemental oxygen  Post-op Assessment: Post-op Vital signs reviewed, Patient's Cardiovascular Status Stable, Respiratory Function Stable, Patent Airway and No signs of Nausea or vomiting  Post-op Vital Signs: Reviewed and stable  Complications: No apparent anesthesia complications

## 2016-02-18 NOTE — Anesthesia Preprocedure Evaluation (Signed)
Anesthesia Evaluation  Patient identified by MRN, date of birth, ID band  Reviewed: NPO status   History of Anesthesia Complications (+) PONV and history of anesthetic complications  Airway Mallampati: II  TM Distance: >3 FB Neck ROM: full    Dental no notable dental hx.    Pulmonary neg pulmonary ROS,    Pulmonary exam normal        Cardiovascular Exercise Tolerance: Good negative cardio ROS Normal cardiovascular exam     Neuro/Psych negative neurological ROS  negative psych ROS   GI/Hepatic negative GI ROS, Neg liver ROS,   Endo/Other  negative endocrine ROS  Renal/GU negative Renal ROS  negative genitourinary   Musculoskeletal   Abdominal   Peds  Hematology negative hematology ROS (+)   Anesthesia Other Findings LEFT SHOULDER SURGERY 4/17    Reproductive/Obstetrics negative OB ROS TAH                              Anesthesia Physical Anesthesia Plan  ASA: I  Anesthesia Plan: General ETT   Post-op Pain Management:    Induction:   Airway Management Planned:   Additional Equipment:   Intra-op Plan:   Post-operative Plan:   Informed Consent: I have reviewed the patients History and Physical, chart, labs and discussed the procedure including the risks, benefits and alternatives for the proposed anesthesia with the patient or authorized representative who has indicated his/her understanding and acceptance.     Plan Discussed with: CRNA  Anesthesia Plan Comments:         Anesthesia Quick Evaluation

## 2016-02-18 NOTE — H&P (Signed)
  H&P has been reviewed and no changes necessary. To be downloaded later. 

## 2016-02-18 NOTE — Anesthesia Postprocedure Evaluation (Signed)
Anesthesia Post Note  Patient: Meredith Taylor  Procedure(s) Performed: Procedure(s) (LRB): TONSILLECTOMY (Bilateral) NASAL VALVE REPAIR WITH NASAL IMPLANT LATERA (Bilateral)  Patient location during evaluation: PACU Anesthesia Type: MAC Level of consciousness: awake and alert Pain management: pain level controlled Vital Signs Assessment: post-procedure vital signs reviewed and stable Respiratory status: spontaneous breathing, nonlabored ventilation, respiratory function stable and patient connected to nasal cannula oxygen Cardiovascular status: stable and blood pressure returned to baseline Anesthetic complications: no Comments: Pt to monitor her BP at home; to f/u with PCP regarding BP control.    Orrin BrighamMaji, Kiora Hallberg

## 2016-02-19 ENCOUNTER — Encounter: Payer: Self-pay | Admitting: Otolaryngology

## 2016-02-22 LAB — SURGICAL PATHOLOGY

## 2016-03-09 ENCOUNTER — Encounter: Payer: Self-pay | Admitting: Physical Therapy

## 2016-03-09 ENCOUNTER — Ambulatory Visit: Payer: Managed Care, Other (non HMO) | Attending: Specialist | Admitting: Physical Therapy

## 2016-03-09 DIAGNOSIS — M25612 Stiffness of left shoulder, not elsewhere classified: Secondary | ICD-10-CM

## 2016-03-09 DIAGNOSIS — M25512 Pain in left shoulder: Secondary | ICD-10-CM | POA: Diagnosis not present

## 2016-03-09 DIAGNOSIS — R2232 Localized swelling, mass and lump, left upper limb: Secondary | ICD-10-CM | POA: Diagnosis present

## 2016-03-09 NOTE — Therapy (Signed)
[place], [place], [address] Phone: [phone]   Fax:  [phone]  Physical Therapy Treatment  Patient Details  Name: Meredith Taylor MRN: [id number] Date of Birth: 12-Feb-1966 Referring Provider: Dr Jomarie Longs  Encounter Date: 03/09/2016      PT End of Session - 03/09/16 0811    Visit Number 11   Date for PT Re-Evaluation 03/16/16   PT Start Time 0802   PT Stop Time 0845   PT Time Calculation (min) 43 min   Activity Tolerance Patient tolerated treatment well   Behavior During Therapy [facility] for tasks assessed/performed      Past Medical History  Diagnosis Date  . Nasal obstruction     nasal valve collapse  . Rhinitis     allergic  . Tonsillitis, chronic   . Environmental allergies   . PONV (postoperative nausea and vomiting)     Past Surgical History  Procedure Laterality Date  . Abdominal hysterectomy    . Shoulder surgery Left 4/17    [facility]  . Back surgery  2010. 12/16    herniated disc.  Dr. Arnoldo Morale  . Tonsillectomy Bilateral 02/18/2016    Procedure: TONSILLECTOMY;  Surgeon: Margaretha Sheffield, MD;  Location: [place];  Service: ENT;  Laterality: Bilateral;    There were no vitals filed for this visit.      Subjective Assessment - 03/09/16 0802    Subjective Patient had her tonsils removed about 3 weeks and has not been in, she reports that she still has a knot in the laterl left arm but is feeling like her ROM is improving   Currently in Pain? No/denies   Aggravating Factors  pain iwth IR to reach bra area, pain up to 4/10            [facility] PT Assessment - 03/09/16 0001    AROM   Left Shoulder Flexion 145 Degrees   Left Shoulder ABduction 141 Degrees   Left Shoulder Internal Rotation 57 Degrees   Left Shoulder External Rotation 72 Degrees     Today's Treatment:  Therex:    CW/CCW x 15 reps Standing scapular retraction with blue TB 2 x 15  reps Standing scapular retraction / extension with blue TB 2 x 15 reps  L shoulder D2 flexion with red TB x 10 reps L shoulder D 1 flexion with red TB x 10 reps  Hooklying:        L shoulder ER with red TB 2 x 10 reps; towel by side used as cue for proper technique        B shoulder protraction with 3# x 15 reps           B scapular retraction with blue TB 2" x 15 reps         L shoulder rhythmic stabilization x 1 min         L shoulder IR with red TB 2 x 10 reps; towel by side used as cue for proper technique   AROM testing          PT Education - 03/09/16 0859    Education provided Yes   Education Details Pt. issued a green TB for scapular retraction HEP activitiy.          PT Short Term Goals - 12/29/15 0848    PT SHORT TERM GOAL #1   Title independent with initial HEP   Status Achieved  PT Long Term Goals - 02/12/16 0841    PT LONG TERM GOAL #1   Title independent with RICE   Status Achieved   PT LONG TERM GOAL #2   Title able to dress and do hair without difficulty   Status Partially Met   PT LONG TERM GOAL #3   Title increase AROM of shoulder flexion to WFL's   Status On-going   PT LONG TERM GOAL #4   Title increase ER/IR actively to 60 degrees    Status On-going               Plan - 03/09/16 0813    Clinical Impression Statement Patient had her tonsils removed about 3 weeks ago and has not been in, she reports that she still has a knot in the lateral left arm but is feeling like her ROM is improving.  Pt. reports she will not be in for next treatment until the 7/28.  Pt. tolerated advancement of L shoulder and scapular strengthening activity well today with focus on treatment on functional movement patterns (D1, D2) at L shoulder and initiation of rhythmic stabilization; pt. was pain free with activities however reported mild fatigue following therex.  Pt. issued a green TB for scapular retraction HEP activitiy today and instructed to kep  using red TB for all other HEP activities.  Pt. to see MD on 7/28 for f/u.         PT Treatment/Interventions ADLs/Self Care Home Management;Cryotherapy;Health visitor;Therapeutic activities;Therapeutic exercise;Patient/family education;Manual techniques;Neuromuscular re-education;Dry needling;Moist Heat;Ultrasound   PT Next Visit Plan We are following MD protocol and will progress to week 13+ upon next treatment; Unless otherwise directed      Patient will benefit from skilled therapeutic intervention in order to improve the following deficits and impairments:  Decreased range of motion, Increased edema, Impaired flexibility, Increased muscle spasms, Impaired UE functional use, Postural dysfunction, Improper body mechanics, Pain  Visit Diagnosis: Pain in left shoulder  Localized swelling, mass and lump, left upper limb  Stiffness of left shoulder, not elsewhere classified     Problem List There are no active problems to display for this patient.   Bess Harvest, PTA 03/09/2016, 9:07 AM  [place], [place], [address] Phone: [phone]   Fax:  [phone]  Name: Meredith Taylor MRN: [id number] Date of Birth: 06/29/1966

## 2016-03-25 ENCOUNTER — Encounter: Payer: Self-pay | Admitting: Physical Therapy

## 2016-03-25 ENCOUNTER — Ambulatory Visit: Payer: Managed Care, Other (non HMO) | Admitting: Physical Therapy

## 2016-03-25 DIAGNOSIS — M25512 Pain in left shoulder: Secondary | ICD-10-CM

## 2016-03-25 DIAGNOSIS — M25612 Stiffness of left shoulder, not elsewhere classified: Secondary | ICD-10-CM

## 2016-03-25 DIAGNOSIS — R2232 Localized swelling, mass and lump, left upper limb: Secondary | ICD-10-CM

## 2016-03-25 NOTE — Therapy (Signed)
[place], [place], [address] Phone: [phone]   Fax:  [phone]  Physical Therapy Treatment  Patient Details  Name: Meredith Taylor MRN: [id number] Date of Birth: February 15, 1966 Referring Provider: Dr Jomarie Longs  Encounter Date: 03/25/2016      PT End of Session - 03/25/16 0835    Visit Number 12   Date for PT Re-Evaluation 04/25/16   PT Start Time 0800   PT Stop Time 0850   PT Time Calculation (min) 50 min   Activity Tolerance Patient tolerated treatment well   Behavior During Therapy [facility] for tasks assessed/performed      Past Medical History:  Diagnosis Date  . Environmental allergies   . Nasal obstruction    nasal valve collapse  . PONV (postoperative nausea and vomiting)   . Rhinitis    allergic  . Tonsillitis, chronic     Past Surgical History:  Procedure Laterality Date  . ABDOMINAL HYSTERECTOMY    . BACK SURGERY  2010. 12/16   herniated disc.  Dr. Arnoldo Morale  . SHOULDER SURGERY Left 4/17   [facility]  . TONSILLECTOMY Bilateral 02/18/2016   Procedure: TONSILLECTOMY;  Surgeon: Margaretha Sheffield, MD;  Location: [place];  Service: ENT;  Laterality: Bilateral;    There were no vitals filed for this visit.      Subjective Assessment - 03/25/16 0803    Subjective Patient has been on vacation over the past two weeks.  She reports that she has no difficulty with ADL's but reports that reaching into the back seat really does cause pain and has limited motions.   Currently in Pain? No/denies   Aggravating Factors  pain iwth reaching into the back seat up to 7-8/10 in the anterior lateral left shoulder            [facility] PT Assessment - 03/25/16 0001      AROM   Left Shoulder Flexion 155 Degrees   Left Shoulder ABduction 150 Degrees   Left Shoulder Internal Rotation 62 Degrees   Left Shoulder External Rotation 80 Degrees                     [facility]  Adult PT Treatment/Exercise - 03/25/16 0001      Shoulder Exercises: Standing   Other Standing Exercises wand exercises all motions with focus on extension and extension with ER   Other Standing Exercises wall slides and circles, ball approximation on wall with small circles, started biceps loading today hammer curls and biceps curls     Shoulder Exercises: Stretch   Star Gazer Stretch 10 seconds;4 reps     Shoulder Exercises: Body Blade   Flexion 30 seconds   ABduction 30 seconds   External Rotation 30 seconds   Internal Rotation 30 seconds   Other Body Blade Exercises IR behind back     Ultrasound   Ultrasound Location left lateral and anterior shoulder   Ultrasound Parameters 1MHz 100% 1.4 w/cm2   Ultrasound Goals Pain     Manual Therapy   Passive ROM PROM of the left shoulder, gentle ossilations, Grade II/III joint mobs of the [facility] joint                PT Education - 03/25/16 0833    Education provided Yes   Education Details went over HEP to progress on own if she does not return to PT   [employer]) Educated Patient  Methods Explanation;Demonstration   Comprehension Verbalized understanding;Returned demonstration          PT Short Term Goals - 12/29/15 0848      PT SHORT TERM GOAL #1   Title independent with initial HEP   Status Achieved           PT Long Term Goals - 03/25/16 0846      PT LONG TERM GOAL #1   Title independent with RICE   Status Achieved     PT LONG TERM GOAL #2   Title able to dress and do hair without difficulty   Status Achieved     PT LONG TERM GOAL #3   Title increase AROM of shoulder flexion to WFL's   Status Partially Met     PT LONG TERM GOAL #4   Title increase ER/IR actively to 60 degrees    Status Achieved               Plan - 03/25/16 0845    Clinical Impression Statement Patient with great ROM and has continued to improve on her own as she has been on vacation, started some biceps loading today and  work on extension.  Still with some c/o stiffness in the anterior lateral shoulder   PT Next Visit Plan Could continue to work on biceps and ROM but feel that she could do on her own   Consulted and Agree with Plan of Care Patient      Patient will benefit from skilled therapeutic intervention in order to improve the following deficits and impairments:  Decreased range of motion, Increased edema, Impaired flexibility, Increased muscle spasms, Impaired UE functional use, Postural dysfunction, Improper body mechanics, Pain  Visit Diagnosis: Pain in left shoulder - Plan: PT plan of care cert/re-cert  Localized swelling, mass and lump, left upper limb - Plan: PT plan of care cert/re-cert  Stiffness of left shoulder, not elsewhere classified - Plan: PT plan of care cert/re-cert     Problem List There are no active problems to display for this patient.   Sumner Boast., PT 03/25/2016, 8:48 AM  [place], [place], [address] Phone: [phone]   Fax:  [phone]  Name: Meredith Taylor MRN: [id number] Date of Birth: 11/11/1965

## 2016-04-11 NOTE — Therapy (Addendum)
D/C goals met

## 2016-08-24 ENCOUNTER — Other Ambulatory Visit: Payer: Self-pay | Admitting: Obstetrics and Gynecology

## 2016-08-24 DIAGNOSIS — Z1231 Encounter for screening mammogram for malignant neoplasm of breast: Secondary | ICD-10-CM

## 2016-09-23 ENCOUNTER — Ambulatory Visit
Admission: RE | Admit: 2016-09-23 | Discharge: 2016-09-23 | Disposition: A | Discharge status: Home / Self Care | Payer: 59 | Source: Ambulatory Visit | Attending: Obstetrics and Gynecology | Admitting: Obstetrics and Gynecology

## 2016-09-23 DIAGNOSIS — Z1231 Encounter for screening mammogram for malignant neoplasm of breast: Secondary | ICD-10-CM

## 2016-09-27 ENCOUNTER — Other Ambulatory Visit: Payer: Self-pay | Admitting: Obstetrics and Gynecology

## 2016-09-27 DIAGNOSIS — R928 Other abnormal and inconclusive findings on diagnostic imaging of breast: Secondary | ICD-10-CM

## 2016-09-28 ENCOUNTER — Ambulatory Visit
Admission: RE | Admit: 2016-09-28 | Discharge: 2016-09-28 | Disposition: A | Discharge status: Home / Self Care | Payer: 59 | Source: Ambulatory Visit | Attending: Obstetrics and Gynecology | Admitting: Obstetrics and Gynecology

## 2016-09-28 DIAGNOSIS — R921 Mammographic calcification found on diagnostic imaging of breast: Secondary | ICD-10-CM | POA: Diagnosis not present

## 2016-09-28 DIAGNOSIS — R928 Other abnormal and inconclusive findings on diagnostic imaging of breast: Secondary | ICD-10-CM

## 2016-10-27 DIAGNOSIS — J019 Acute sinusitis, unspecified: Secondary | ICD-10-CM | POA: Diagnosis not present

## 2016-10-27 DIAGNOSIS — H6983 Other specified disorders of Eustachian tube, bilateral: Secondary | ICD-10-CM | POA: Diagnosis not present

## 2017-03-24 DIAGNOSIS — H1012 Acute atopic conjunctivitis, left eye: Secondary | ICD-10-CM | POA: Diagnosis not present

## 2017-04-04 DIAGNOSIS — H0015 Chalazion left lower eyelid: Secondary | ICD-10-CM | POA: Diagnosis not present

## 2017-05-26 DIAGNOSIS — J019 Acute sinusitis, unspecified: Secondary | ICD-10-CM | POA: Diagnosis not present

## 2017-05-26 DIAGNOSIS — J301 Allergic rhinitis due to pollen: Secondary | ICD-10-CM | POA: Diagnosis not present

## 2017-05-26 DIAGNOSIS — J040 Acute laryngitis: Secondary | ICD-10-CM | POA: Diagnosis not present

## 2017-05-31 DIAGNOSIS — Z6820 Body mass index (BMI) 20.0-20.9, adult: Secondary | ICD-10-CM | POA: Diagnosis not present

## 2017-05-31 DIAGNOSIS — Z01419 Encounter for gynecological examination (general) (routine) without abnormal findings: Secondary | ICD-10-CM | POA: Diagnosis not present

## 2017-06-01 DIAGNOSIS — Z0000 Encounter for general adult medical examination without abnormal findings: Secondary | ICD-10-CM | POA: Diagnosis not present

## 2017-06-01 DIAGNOSIS — E7800 Pure hypercholesterolemia, unspecified: Secondary | ICD-10-CM | POA: Diagnosis not present

## 2017-08-18 ENCOUNTER — Other Ambulatory Visit: Payer: Self-pay | Admitting: Obstetrics and Gynecology

## 2017-08-18 DIAGNOSIS — Z1231 Encounter for screening mammogram for malignant neoplasm of breast: Secondary | ICD-10-CM

## 2017-08-27 DIAGNOSIS — J0100 Acute maxillary sinusitis, unspecified: Secondary | ICD-10-CM | POA: Diagnosis not present

## 2017-09-29 ENCOUNTER — Ambulatory Visit
Admission: RE | Admit: 2017-09-29 | Discharge: 2017-09-29 | Disposition: A | Discharge status: Home / Self Care | Payer: 59 | Source: Ambulatory Visit | Attending: Obstetrics and Gynecology | Admitting: Obstetrics and Gynecology

## 2017-09-29 DIAGNOSIS — Z1231 Encounter for screening mammogram for malignant neoplasm of breast: Secondary | ICD-10-CM | POA: Diagnosis not present

## 2018-06-01 DIAGNOSIS — J301 Allergic rhinitis due to pollen: Secondary | ICD-10-CM | POA: Diagnosis not present

## 2018-06-01 DIAGNOSIS — J069 Acute upper respiratory infection, unspecified: Secondary | ICD-10-CM | POA: Diagnosis not present

## 2018-06-05 DIAGNOSIS — R233 Spontaneous ecchymoses: Secondary | ICD-10-CM | POA: Diagnosis not present

## 2018-06-05 DIAGNOSIS — Z0000 Encounter for general adult medical examination without abnormal findings: Secondary | ICD-10-CM | POA: Diagnosis not present

## 2018-06-05 DIAGNOSIS — E7800 Pure hypercholesterolemia, unspecified: Secondary | ICD-10-CM | POA: Diagnosis not present

## 2018-06-13 DIAGNOSIS — Z01419 Encounter for gynecological examination (general) (routine) without abnormal findings: Secondary | ICD-10-CM | POA: Diagnosis not present

## 2018-06-13 DIAGNOSIS — Z6822 Body mass index (BMI) 22.0-22.9, adult: Secondary | ICD-10-CM | POA: Diagnosis not present

## 2018-08-29 DIAGNOSIS — Z8616 Personal history of COVID-19: Secondary | ICD-10-CM

## 2018-08-29 HISTORY — DX: Personal history of COVID-19: Z86.16

## 2018-10-17 ENCOUNTER — Other Ambulatory Visit: Payer: Self-pay | Admitting: Obstetrics and Gynecology

## 2018-10-17 DIAGNOSIS — Z1231 Encounter for screening mammogram for malignant neoplasm of breast: Secondary | ICD-10-CM

## 2018-11-16 ENCOUNTER — Ambulatory Visit: Payer: 59

## 2018-12-27 ENCOUNTER — Ambulatory Visit: Payer: Self-pay

## 2019-02-11 ENCOUNTER — Ambulatory Visit
Admission: RE | Admit: 2019-02-11 | Discharge: 2019-02-11 | Disposition: A | Discharge status: Home / Self Care | Payer: Commercial Managed Care - PPO | Source: Ambulatory Visit | Attending: Obstetrics and Gynecology | Admitting: Obstetrics and Gynecology

## 2019-02-11 ENCOUNTER — Other Ambulatory Visit: Payer: Self-pay

## 2019-02-11 DIAGNOSIS — Z1231 Encounter for screening mammogram for malignant neoplasm of breast: Secondary | ICD-10-CM

## 2019-06-07 ENCOUNTER — Ambulatory Visit
Admission: RE | Admit: 2019-06-07 | Discharge: 2019-06-07 | Disposition: A | Discharge status: Home / Self Care | Payer: Commercial Managed Care - PPO | Source: Ambulatory Visit | Attending: Family Medicine | Admitting: Family Medicine

## 2019-06-07 ENCOUNTER — Other Ambulatory Visit: Payer: Self-pay | Admitting: Family Medicine

## 2019-06-07 DIAGNOSIS — Z0000 Encounter for general adult medical examination without abnormal findings: Secondary | ICD-10-CM

## 2019-10-26 ENCOUNTER — Ambulatory Visit: Payer: Commercial Managed Care - PPO | Attending: Internal Medicine

## 2019-10-26 DIAGNOSIS — Z23 Encounter for immunization: Secondary | ICD-10-CM | POA: Insufficient documentation

## 2019-10-26 NOTE — Progress Notes (Signed)
   Covid-19 Vaccination Clinic  Name:  JANNATUL WOJDYLA    MRN: [id number] DOB: February 20, 1966  10/26/2019  Ms. Fei was observed post Covid-19 immunization for 15 minutes without incidence. She was provided with Vaccine Information Sheet and instruction to access the V-Safe system.   Ms. Wittmeyer was instructed to call 911 with any severe reactions post vaccine: Marland Kitchen Difficulty breathing  . Swelling of your face and throat  . A fast heartbeat  . A bad rash all over your body  . Dizziness and weakness    Immunizations Administered    Name Date Dose VIS Date Route   Pfizer COVID-19 Vaccine 10/26/2019  1:54 PM 0.3 mL 08/09/2019 Intramuscular   Manufacturer: [employer], [employer]   Lot: [id number]   NDC: [address]-9

## 2019-11-16 ENCOUNTER — Ambulatory Visit: Payer: Commercial Managed Care - PPO | Attending: Internal Medicine

## 2019-11-16 DIAGNOSIS — Z23 Encounter for immunization: Secondary | ICD-10-CM

## 2019-11-16 NOTE — Progress Notes (Signed)
   Covid-19 Vaccination Clinic  Name:  Meredith Taylor    MRN: [id number] DOB: 1965/11/23  11/16/2019  [name] was observed post Covid-19 immunization for 15 minutes without incident. She was provided with Vaccine Information Sheet and instruction to access the V-Safe system.   [name] was instructed to call 911 with any severe reactions post vaccine: Marland Kitchen Difficulty breathing  . Swelling of face and throat  . A fast heartbeat  . A bad rash all over body  . Dizziness and weakness   Immunizations Administered    Name Date Dose VIS Date Route   Pfizer COVID-19 Vaccine 11/16/2019  8:11 AM 0.3 mL 08/09/2019 Intramuscular   Manufacturer: [employer], [employer]   Lot: [id number]   NDC: [address]-1

## 2020-01-10 ENCOUNTER — Other Ambulatory Visit: Payer: Self-pay | Admitting: Obstetrics and Gynecology

## 2020-01-10 DIAGNOSIS — Z1231 Encounter for screening mammogram for malignant neoplasm of breast: Secondary | ICD-10-CM

## 2020-02-17 ENCOUNTER — Ambulatory Visit
Admission: RE | Admit: 2020-02-17 | Discharge: 2020-02-17 | Disposition: A | Discharge status: Home / Self Care | Payer: Commercial Managed Care - PPO | Source: Ambulatory Visit | Attending: Obstetrics and Gynecology | Admitting: Obstetrics and Gynecology

## 2020-02-17 ENCOUNTER — Ambulatory Visit: Payer: Commercial Managed Care - PPO

## 2020-02-17 ENCOUNTER — Other Ambulatory Visit: Payer: Self-pay

## 2020-02-17 DIAGNOSIS — Z1231 Encounter for screening mammogram for malignant neoplasm of breast: Secondary | ICD-10-CM

## 2020-02-20 ENCOUNTER — Other Ambulatory Visit: Payer: Self-pay | Admitting: Obstetrics and Gynecology

## 2020-02-20 DIAGNOSIS — R928 Other abnormal and inconclusive findings on diagnostic imaging of breast: Secondary | ICD-10-CM

## 2020-02-27 ENCOUNTER — Other Ambulatory Visit: Payer: Self-pay

## 2020-02-27 ENCOUNTER — Ambulatory Visit
Admission: RE | Admit: 2020-02-27 | Discharge: 2020-02-27 | Disposition: A | Discharge status: Home / Self Care | Payer: Commercial Managed Care - PPO | Source: Ambulatory Visit | Attending: Obstetrics and Gynecology | Admitting: Obstetrics and Gynecology

## 2020-02-27 DIAGNOSIS — R928 Other abnormal and inconclusive findings on diagnostic imaging of breast: Secondary | ICD-10-CM

## 2021-02-19 ENCOUNTER — Other Ambulatory Visit: Payer: Self-pay | Admitting: Obstetrics and Gynecology

## 2021-02-19 DIAGNOSIS — Z1231 Encounter for screening mammogram for malignant neoplasm of breast: Secondary | ICD-10-CM

## 2021-03-22 ENCOUNTER — Ambulatory Visit: Payer: Self-pay | Admitting: General Surgery

## 2021-03-22 DIAGNOSIS — K642 Third degree hemorrhoids: Secondary | ICD-10-CM | POA: Diagnosis not present

## 2021-03-22 NOTE — H&P (Unsigned)
Marland Kitchen      REFERRING PHYSICIAN:  Meriel Pica, MD   PROVIDER:  Elenora Gamma, MD   MRN: [id number] DOB: 1965-12-31 DATE OF ENCOUNTER: 03/22/2021   Subjective    Chief Complaint: Hemorrhoids       History of Present Illness: Meredith Taylor is a 55 y.o. female who is seen today as an office consultation at the request of Dr. Marcelle Overlie for evaluation of Hemorrhoids Patient states that she has had hemorrhoids for years.  Over the past 1 to 2 years she has had an increase in symptoms.  This is mainly prolapse and leakage.  She has bleeding with bowel movements.  Last colonoscopy was approximately 3 years ago and normal per patient.  She denies any constipation or straining or frequent bowel movements.  She has never had any hemorrhoid procedures in the past.     Review of Systems: A complete review of systems was obtained from the patient.  I have reviewed this information and discussed as appropriate with the patient.  See HPI as well for other ROS.   Medical History: History reviewed. No pertinent past medical history.   There is no problem list on file for this patient.          Past Surgical History:  Procedure Laterality Date   excision ovarian cyst N/A     HYSTERECTOMY       SPINE SURGERY          No Known Allergies         Current Outpatient Medications on File Prior to Visit  Medication Sig Dispense Refill   estradioL (ESTRACE) 2 MG tablet Take by mouth         estradiol (VIVELLE-DOT) patch 0.05 mg/24 hr         loratadine (CLARITIN) 10 mg tablet Take by mouth        No current facility-administered medications on file prior to visit.           Family History  Problem Relation Age of Onset   Stroke Mother     High blood pressure (Hypertension) Mother     Hyperlipidemia (Elevated cholesterol) Mother     Coronary Artery Disease (Blocked arteries around heart) Mother     Skin cancer Father     High blood pressure (Hypertension) Father      Hyperlipidemia (Elevated cholesterol) Father     Coronary Artery Disease (Blocked arteries around heart) Father     Diabetes Father        Social History       Tobacco Use  Smoking Status Never Smoker  Smokeless Tobacco Never Used      Social History        Socioeconomic History   Marital status: Married  Tobacco Use   Smoking status: Never Smoker   Smokeless tobacco: Never Used  Building services engineer Use: Never used  Substance and Sexual Activity   Alcohol use: Yes   Drug use: Never   Sexual activity: Defer      Objective:         Vitals:    03/22/21 0933  BP: 126/80  Pulse: 67  Temp: 36.7 C (98.1 F)  SpO2: 100%  Weight: 69.3 kg (152 lb 12.8 oz)  Height: 172.7 cm (5\' 8" )      Exam Gen: NAD Abd: soft Rectal     Procedure: Anoscopy Surgeon: After the risks and  benefits were explained, written consent was obtained for above procedure.  A medical assistant chaperone was present thoroughout the entire procedure.  Anesthesia: none Diagnosis: rectal bleeding, hemorrhoids Findings: grade 3 LL and RP hem, grade 2 RA hem     Labs, Imaging and Diagnostic Testing: none   Assessment and Plan:  Diagnoses and all orders for this visit:   Prolapsed internal hemorrhoids, grade 3       Patient presents to the emergency department with prolapsing hemorrhoids requiring manual reduction.  She is also having rectal bleeding with every bowel movement.  I recommended that she receive treatment for this.  We discussed rubber band ligation here in the office.  We discussed with grade 3 hemorrhoids the chance of success long-term is approximately 50%.  We then discussed trans hemorrhoidal dearterialization.  We discussed that this has a very good success rate for bleeding and a similar success rate for prolapse.  We discussed that this is an outpatient surgical procedure and we discussed the typical postoperative pain after surgery.  Risk associated with the  procedure are bleeding pain and recurrence.  I believe the patient understands this and wishes to proceed with trans hemorrhoidal dearterialization.  All questions were answered.   No follow-ups on file.

## 2021-04-15 ENCOUNTER — Ambulatory Visit
Admission: RE | Admit: 2021-04-15 | Discharge: 2021-04-15 | Disposition: A | Discharge status: Home / Self Care | Payer: BC Managed Care – PPO | Source: Ambulatory Visit | Attending: Obstetrics and Gynecology | Admitting: Obstetrics and Gynecology

## 2021-04-15 ENCOUNTER — Other Ambulatory Visit: Payer: Self-pay

## 2021-04-15 DIAGNOSIS — Z1231 Encounter for screening mammogram for malignant neoplasm of breast: Secondary | ICD-10-CM | POA: Diagnosis not present

## 2021-05-10 ENCOUNTER — Encounter (HOSPITAL_BASED_OUTPATIENT_CLINIC_OR_DEPARTMENT_OTHER): Payer: Self-pay | Admitting: General Surgery

## 2021-05-11 ENCOUNTER — Encounter (HOSPITAL_BASED_OUTPATIENT_CLINIC_OR_DEPARTMENT_OTHER): Payer: Self-pay | Admitting: General Surgery

## 2021-05-11 ENCOUNTER — Other Ambulatory Visit: Payer: Self-pay

## 2021-05-11 NOTE — Progress Notes (Signed)
Spoke w/ via phone for pre-op interview--- pt Lab needs dos----   no            Lab results------ no COVID test -----patient states asymptomatic no test needed Arrive at ------- 1030 on 05-14-2021 NPO after MN NO Solid Food.  Clear liquids from MN until--- 0930 Med rec completed Medications to take morning of surgery ----- none Diabetic medication ----- n/a Patient instructed no nail polish to be worn day of surgery Patient instructed to bring photo id and insurance card day of surgery Patient aware to have Driver (ride ) / caregiver for 24 hours after surgery --husband, joseph Patient Special Instructions ----- n/a Pre-Op special Istructions ----- n/a Patient verbalized understanding of instructions that were given at this phone interview. Patient denies shortness of breath, chest pain, fever, cough at this phone interview.

## 2021-05-13 NOTE — Anesthesia Preprocedure Evaluation (Addendum)
Anesthesia Evaluation  Patient identified by MRN, date of birth, ID band Patient awake    Reviewed: Allergy & Precautions, NPO status , Patient's Chart, lab work & pertinent test results  History of Anesthesia Complications (+) PONV and history of anesthetic complications (does well w/ scop patch, no issues w/ cscope sedation)  Airway Mallampati: II  TM Distance: >3 FB Neck ROM: Full    Dental no notable dental hx. (+) Teeth Intact, Dental Advisory Given   Pulmonary neg pulmonary ROS,    Pulmonary exam normal breath sounds clear to auscultation       Cardiovascular negative cardio ROS Normal cardiovascular exam Rhythm:Regular Rate:Normal     Neuro/Psych negative neurological ROS  negative psych ROS   GI/Hepatic negative GI ROS, Neg liver ROS,   Endo/Other  negative endocrine ROS  Renal/GU negative Renal ROS  negative genitourinary   Musculoskeletal negative musculoskeletal ROS (+)   Abdominal   Peds  Hematology negative hematology ROS (+)   Anesthesia Other Findings hemorrhoids  Reproductive/Obstetrics negative OB ROS                            Anesthesia Physical Anesthesia Plan  ASA: 1  Anesthesia Plan: MAC   Post-op Pain Management:    Induction:   PONV Risk Score and Plan: 2 and Propofol infusion and TIVA  Airway Management Planned: Natural Airway and Simple Face Mask  Additional Equipment: None  Intra-op Plan:   Post-operative Plan:   Informed Consent: I have reviewed the patients History and Physical, chart, labs and discussed the procedure including the risks, benefits and alternatives for the proposed anesthesia with the patient or authorized representative who has indicated his/her understanding and acceptance.     Dental advisory given  Plan Discussed with: CRNA  Anesthesia Plan Comments:        Anesthesia Quick Evaluation

## 2021-05-14 ENCOUNTER — Encounter (HOSPITAL_BASED_OUTPATIENT_CLINIC_OR_DEPARTMENT_OTHER)
Admission: RE | Disposition: A | Discharge status: Home / Self Care | Payer: Self-pay | Source: Home / Self Care | Attending: General Surgery

## 2021-05-14 ENCOUNTER — Encounter (HOSPITAL_BASED_OUTPATIENT_CLINIC_OR_DEPARTMENT_OTHER): Payer: Self-pay | Admitting: General Surgery

## 2021-05-14 ENCOUNTER — Other Ambulatory Visit: Payer: Self-pay

## 2021-05-14 ENCOUNTER — Ambulatory Visit (HOSPITAL_BASED_OUTPATIENT_CLINIC_OR_DEPARTMENT_OTHER): Payer: BC Managed Care – PPO | Admitting: Anesthesiology

## 2021-05-14 ENCOUNTER — Ambulatory Visit (HOSPITAL_BASED_OUTPATIENT_CLINIC_OR_DEPARTMENT_OTHER)
Admission: RE | Admit: 2021-05-14 | Discharge: 2021-05-14 | Disposition: A | Discharge status: Home / Self Care | Payer: BC Managed Care – PPO | Attending: General Surgery | Admitting: General Surgery

## 2021-05-14 DIAGNOSIS — Z79890 Hormone replacement therapy (postmenopausal): Secondary | ICD-10-CM | POA: Diagnosis not present

## 2021-05-14 DIAGNOSIS — J309 Allergic rhinitis, unspecified: Secondary | ICD-10-CM | POA: Diagnosis not present

## 2021-05-14 DIAGNOSIS — K642 Third degree hemorrhoids: Secondary | ICD-10-CM | POA: Insufficient documentation

## 2021-05-14 HISTORY — DX: Other hemorrhoids: K64.8

## 2021-05-14 HISTORY — PX: HEMORRHOID SURGERY: SHX153

## 2021-05-14 HISTORY — DX: Presence of spectacles and contact lenses: Z97.3

## 2021-05-14 SURGERY — HEMORRHOIDECTOMY
Anesthesia: Monitor Anesthesia Care | Site: Rectum

## 2021-05-14 MED ORDER — OXYCODONE HCL 5 MG PO TABS
5.0000 mg | ORAL_TABLET | Freq: Once | ORAL | Status: AC | PRN
  Administered 2021-05-14: 5 mg via ORAL

## 2021-05-14 MED ORDER — FENTANYL CITRATE (PF) 100 MCG/2ML IJ SOLN
INTRAMUSCULAR | Status: DC | PRN
  Administered 2021-05-14: 25 ug via INTRAVENOUS

## 2021-05-14 MED ORDER — CELECOXIB 200 MG PO CAPS
200.0000 mg | ORAL_CAPSULE | ORAL | Status: AC
  Administered 2021-05-14: 200 mg via ORAL

## 2021-05-14 MED ORDER — OXYCODONE HCL 5 MG PO TABS
ORAL_TABLET | ORAL | Status: AC
  Filled 2021-05-14: qty 1

## 2021-05-14 MED ORDER — ACETAMINOPHEN 500 MG PO TABS
1000.0000 mg | ORAL_TABLET | ORAL | Status: AC
  Administered 2021-05-14: 1000 mg via ORAL

## 2021-05-14 MED ORDER — KETAMINE HCL 10 MG/ML IJ SOLN
INTRAMUSCULAR | Status: DC | PRN
  Administered 2021-05-14: 15 mg via INTRAVENOUS

## 2021-05-14 MED ORDER — DEXAMETHASONE SODIUM PHOSPHATE 10 MG/ML IJ SOLN
INTRAMUSCULAR | Status: AC
  Filled 2021-05-14: qty 1

## 2021-05-14 MED ORDER — HYDROMORPHONE HCL 1 MG/ML IJ SOLN: 0.2500 mg | INTRAMUSCULAR | Status: DC | PRN

## 2021-05-14 MED ORDER — OXYCODONE HCL 5 MG/5ML PO SOLN
5.0000 mg | Freq: Once | ORAL | Status: AC | PRN
Start: 2021-05-14 — End: 2021-05-14

## 2021-05-14 MED ORDER — OXYCODONE HCL 5 MG PO TABS: 5.0000 mg | ORAL_TABLET | Freq: Four times a day (QID) | ORAL | 0 refills | Status: AC | PRN

## 2021-05-14 MED ORDER — PHENYLEPHRINE 40 MCG/ML (10ML) SYRINGE FOR IV PUSH (FOR BLOOD PRESSURE SUPPORT)
PREFILLED_SYRINGE | INTRAVENOUS | Status: AC
  Filled 2021-05-14: qty 40

## 2021-05-14 MED ORDER — OXYCODONE HCL 5 MG PO TABS: 5.0000 mg | ORAL_TABLET | ORAL | Status: DC | PRN

## 2021-05-14 MED ORDER — PROPOFOL 10 MG/ML IV BOLUS
INTRAVENOUS | Status: DC | PRN
  Administered 2021-05-14 (×2): 20 mg via INTRAVENOUS

## 2021-05-14 MED ORDER — LACTATED RINGERS IV SOLN
INTRAVENOUS | Status: DC
  Administered 2021-05-14: 1000 mL via INTRAVENOUS

## 2021-05-14 MED ORDER — SODIUM CHLORIDE 0.9% FLUSH: 3.0000 mL | INTRAVENOUS | Status: DC | PRN

## 2021-05-14 MED ORDER — MEPERIDINE HCL 25 MG/ML IJ SOLN: 6.2500 mg | INTRAMUSCULAR | Status: DC | PRN

## 2021-05-14 MED ORDER — MIDAZOLAM HCL 2 MG/2ML IJ SOLN
INTRAMUSCULAR | Status: DC | PRN
Start: 2021-05-14 — End: 2021-05-14
  Administered 2021-05-14: 2 mg via INTRAVENOUS

## 2021-05-14 MED ORDER — DEXAMETHASONE SODIUM PHOSPHATE 10 MG/ML IJ SOLN
INTRAMUSCULAR | Status: DC | PRN
  Administered 2021-05-14: 8 mg via INTRAVENOUS

## 2021-05-14 MED ORDER — ACETAMINOPHEN 325 MG RE SUPP: 650.0000 mg | RECTAL | Status: DC | PRN

## 2021-05-14 MED ORDER — KETAMINE HCL 50 MG/5ML IJ SOSY
PREFILLED_SYRINGE | INTRAMUSCULAR | Status: AC
  Filled 2021-05-14: qty 5

## 2021-05-14 MED ORDER — PROPOFOL 500 MG/50ML IV EMUL
INTRAVENOUS | Status: AC
  Filled 2021-05-14: qty 100

## 2021-05-14 MED ORDER — ONDANSETRON HCL 4 MG/2ML IJ SOLN
INTRAMUSCULAR | Status: DC | PRN
  Administered 2021-05-14: 4 mg via INTRAVENOUS

## 2021-05-14 MED ORDER — SODIUM CHLORIDE 0.9% FLUSH: 3.0000 mL | Freq: Two times a day (BID) | INTRAVENOUS | Status: DC

## 2021-05-14 MED ORDER — BUPIVACAINE-EPINEPHRINE 0.5% -1:200000 IJ SOLN
INTRAMUSCULAR | Status: DC | PRN
  Administered 2021-05-14: 30 mL

## 2021-05-14 MED ORDER — GABAPENTIN 300 MG PO CAPS
ORAL_CAPSULE | ORAL | Status: AC
  Filled 2021-05-14: qty 1

## 2021-05-14 MED ORDER — PROPOFOL 10 MG/ML IV BOLUS
INTRAVENOUS | Status: AC
  Filled 2021-05-14: qty 20

## 2021-05-14 MED ORDER — CELECOXIB 200 MG PO CAPS
ORAL_CAPSULE | ORAL | Status: AC
  Filled 2021-05-14: qty 1

## 2021-05-14 MED ORDER — FENTANYL CITRATE (PF) 100 MCG/2ML IJ SOLN
INTRAMUSCULAR | Status: AC
  Filled 2021-05-14: qty 2

## 2021-05-14 MED ORDER — KETOROLAC TROMETHAMINE 30 MG/ML IJ SOLN: 30.0000 mg | Freq: Once | INTRAMUSCULAR | Status: DC | PRN

## 2021-05-14 MED ORDER — 0.9 % SODIUM CHLORIDE (POUR BTL) OPTIME
TOPICAL | Status: DC | PRN
  Administered 2021-05-14: 500 mL

## 2021-05-14 MED ORDER — PROMETHAZINE HCL 25 MG/ML IJ SOLN: 6.2500 mg | INTRAMUSCULAR | Status: DC | PRN

## 2021-05-14 MED ORDER — GABAPENTIN 300 MG PO CAPS
300.0000 mg | ORAL_CAPSULE | ORAL | Status: AC
Start: 2021-05-14 — End: 2021-05-14
  Administered 2021-05-14: 300 mg via ORAL

## 2021-05-14 MED ORDER — BUPIVACAINE LIPOSOME 1.3 % IJ SUSP: 20.0000 mL | Freq: Once | INTRAMUSCULAR | Status: DC

## 2021-05-14 MED ORDER — LIDOCAINE HCL (CARDIAC) PF 100 MG/5ML IV SOSY
PREFILLED_SYRINGE | INTRAVENOUS | Status: DC | PRN
  Administered 2021-05-14: 20 mg via INTRAVENOUS

## 2021-05-14 MED ORDER — KETOROLAC TROMETHAMINE 30 MG/ML IJ SOLN
INTRAMUSCULAR | Status: AC
  Filled 2021-05-14: qty 2

## 2021-05-14 MED ORDER — GLYCOPYRROLATE PF 0.2 MG/ML IJ SOSY
PREFILLED_SYRINGE | INTRAMUSCULAR | Status: AC
  Filled 2021-05-14: qty 3

## 2021-05-14 MED ORDER — BUPIVACAINE LIPOSOME 1.3 % IJ SUSP
INTRAMUSCULAR | Status: DC | PRN
  Administered 2021-05-14: 20 mL

## 2021-05-14 MED ORDER — LIDOCAINE HCL (PF) 2 % IJ SOLN
INTRAMUSCULAR | Status: AC
  Filled 2021-05-14: qty 10

## 2021-05-14 MED ORDER — ONDANSETRON HCL 4 MG/2ML IJ SOLN
INTRAMUSCULAR | Status: AC
  Filled 2021-05-14: qty 2

## 2021-05-14 MED ORDER — ACETAMINOPHEN 500 MG PO TABS
ORAL_TABLET | ORAL | Status: AC
  Filled 2021-05-14: qty 2

## 2021-05-14 MED ORDER — ACETAMINOPHEN 325 MG PO TABS: 650.0000 mg | ORAL_TABLET | ORAL | Status: DC | PRN

## 2021-05-14 MED ORDER — SODIUM CHLORIDE 0.9 % IV SOLN: 250.0000 mL | INTRAVENOUS | Status: DC | PRN

## 2021-05-14 MED ORDER — PROPOFOL 500 MG/50ML IV EMUL
INTRAVENOUS | Status: DC | PRN
  Administered 2021-05-14: 125 ug/kg/min via INTRAVENOUS

## 2021-05-14 MED ORDER — MIDAZOLAM HCL 2 MG/2ML IJ SOLN
INTRAMUSCULAR | Status: AC
  Filled 2021-05-14: qty 2

## 2021-05-14 SURGICAL SUPPLY — 35 items
BRIEF STRETCH FOR OB PAD XXL (UNDERPADS AND DIAPERS) ×2 IMPLANT
COVER BACK TABLE 60X90IN (DRAPES) ×2 IMPLANT
DECANTER SPIKE VIAL GLASS SM (MISCELLANEOUS) IMPLANT
DRAPE HYSTEROSCOPY (MISCELLANEOUS) ×2 IMPLANT
DRAPE SHEET LG 3/4 BI-LAMINATE (DRAPES) ×2 IMPLANT
DRSG PAD ABDOMINAL 8X10 ST (GAUZE/BANDAGES/DRESSINGS) ×2 IMPLANT
ELECT REM PT RETURN 9FT ADLT (ELECTROSURGICAL) ×2
ELECTRODE REM PT RTRN 9FT ADLT (ELECTROSURGICAL) ×1 IMPLANT
GAUZE 4X4 16PLY ~~LOC~~+RFID DBL (SPONGE) ×2 IMPLANT
GAUZE SPONGE 4X4 12PLY STRL (GAUZE/BANDAGES/DRESSINGS) IMPLANT
GAUZE SPONGE 4X4 12PLY STRL LF (GAUZE/BANDAGES/DRESSINGS) ×2 IMPLANT
GLOVE SURG ENC MOIS LTX SZ6.5 (GLOVE) ×2 IMPLANT
GLOVE SURG UNDER LTX SZ6.5 (GLOVE) ×2 IMPLANT
GOWN STRL REUS W/TWL XL LVL3 (GOWN DISPOSABLE) ×2 IMPLANT
HEMOSTAT SURGICEL 4X8 (HEMOSTASIS) IMPLANT
KIT SIGMOIDOSCOPE (SET/KITS/TRAYS/PACK) IMPLANT
KIT SLIDE ONE PROLAPS HEMORR (KITS) ×2 IMPLANT
KIT TURNOVER CYSTO (KITS) ×2 IMPLANT
LEGGING LITHOTOMY PAIR STRL (DRAPES) ×2 IMPLANT
LUBRICANT JELLY K Y 4OZ (MISCELLANEOUS) ×2 IMPLANT
NEEDLE HYPO 22GX1.5 SAFETY (NEEDLE) ×2 IMPLANT
PACK BASIN DAY SURGERY FS (CUSTOM PROCEDURE TRAY) ×2 IMPLANT
PAD ARMBOARD 7.5X6 YLW CONV (MISCELLANEOUS) IMPLANT
PANTS MESH DISP LRG (UNDERPADS AND DIAPERS) IMPLANT
PANTS MESH DISPOSABLE L (UNDERPADS AND DIAPERS)
PENCIL SMOKE EVACUATOR (MISCELLANEOUS) ×2 IMPLANT
SPONGE HEMORRHOID 8X3CM (HEMOSTASIS) IMPLANT
SUT CHROMIC 2 0 SH (SUTURE) IMPLANT
SUT CHROMIC 3 0 SH 27 (SUTURE) IMPLANT
SUT VIC AB 2-0 UR6 27 (SUTURE) IMPLANT
SYR CONTROL 10ML LL (SYRINGE) ×2 IMPLANT
TOWEL OR 17X26 10 PK STRL BLUE (TOWEL DISPOSABLE) ×2 IMPLANT
TRAY DSU PREP LF (CUSTOM PROCEDURE TRAY) ×2 IMPLANT
TUBE CONNECTING 12X1/4 (SUCTIONS) ×2 IMPLANT
YANKAUER SUCT BULB TIP NO VENT (SUCTIONS) ×2 IMPLANT

## 2021-05-14 NOTE — Transfer of Care (Signed)
Immediate Anesthesia Transfer of Care Note  Patient: Meredith Taylor  Procedure(s) Performed: TRANSANAL HEMORRHOIDAL DEARTERIALIZATION (Rectum)  Patient Location: PACU  Anesthesia Type:MAC  Level of Consciousness: awake, alert , oriented and patient cooperative  Airway & Oxygen Therapy: Patient Spontanous Breathing  Post-op Assessment: Report given to RN and Post -op Vital signs reviewed and stable  Post vital signs: Reviewed and stable  Last Vitals:  Vitals Value Taken Time  BP    Temp    Pulse 84 05/14/21 1315  Resp 23 05/14/21 1316  SpO2 100 % 05/14/21 1315  Vitals shown include unvalidated device data.  Last Pain:  Vitals:   05/14/21 1059  TempSrc: Oral  PainSc: 0-No pain      Patients Stated Pain Goal: 5 (37/04/88 8916)  Complications: No notable events documented.

## 2021-05-14 NOTE — Discharge Instructions (Addendum)
ANORECTAL SURGERY: POST OP INSTRUCTIONS Take your usually prescribed home medications unless otherwise directed. DIET: During the first few hours after surgery sip on some liquids until you are able to urinate.  It is normal to not urinate for several hours after this surgery.  If you feel uncomfortable, please contact the office for instructions.  After you are able to urinate,you may eat, if you feel like it.  Follow a light bland diet the first 24 hours after arrival home, such as soup, liquids, crackers, etc.  Be sure to include lots of fluids daily (6-8 glasses).  Avoid fast food or heavy meals, as your are more likely to get nauseated.  Eat a low fat diet the next few days after surgery.  Limit caffeine intake to 1-2 servings a day. PAIN CONTROL: Pain is best controlled by a usual combination of several different methods TOGETHER: Muscle relaxation: Soak in a warm bath (or Sitz bath) three times a day and after bowel movements.  Continue to do this until all pain is resolved. Over the counter pain medication Prescription pain medication Most patients will experience some swelling and discomfort in the anus/rectal area and incisions.  Heat such as warm towels, sitz baths, warm baths, etc to help relax tight/sore spots and speed recovery.  Some people prefer to use ice, especially in the first couple days after surgery, as it may decrease the pain and swelling, or alternate between ice & heat.  Experiment to what works for you.  Swelling and bruising can take several weeks to resolve.  Pain can take even longer to completely resolve. It is helpful to take an over-the-counter pain medication regularly for the first few weeks.  Choose one of the following that works best for you: Naproxen (Aleve, etc)  Two 220mg tabs twice a day Ibuprofen (Advil, etc) Three 200mg tabs four times a day (every meal & bedtime) A  prescription for pain medication (such as percocet, oxycodone, hydrocodone, etc) should be  given to you upon discharge.  Take your pain medication as prescribed.  If you are having problems/concerns with the prescription medicine (does not control pain, nausea, vomiting, rash, itching, etc), please call us [phone] to see if we need to switch you to a different pain medicine that will work better for you and/or control your side effect better. If you need a refill on your pain medication, please contact your pharmacy.  They will contact our office to request authorization. Prescriptions will not be filled after 5 pm or on week-ends. KEEP YOUR BOWELS REGULAR and AVOID CONSTIPATION The goal is one to two soft bowel movements a day.  You should at least have a bowel movement every other day. Avoid getting constipated.  Between the surgery and the pain medications, it is common to experience some constipation. This can be very painful after rectal surgery.  Increasing fluid intake and taking a fiber supplement (such as Metamucil, Citrucel, FiberCon, etc) 1-2 times a day regularly will usually help prevent this problem from occurring.  A stool softener like colace is also recommended.  This can be purchased over the counter at your pharmacy.  You can take it up to 3 times a day.  If you do not have a bowel movement after 24 hrs since your surgery, take one does of milk of magnesia.  If you still haven't had a bowel movement 8-12 hours after that dose, take another dose.  If you don't have a bowel movement 48 hrs after surgery,   purchase a Fleets enema from the drug store and administer gently per package instructions.  If you still are having trouble with your bowel movements after that, please call the office for further instructions. If you develop diarrhea or have many loose bowel movements, simplify your diet to bland foods & liquids for a few days.  Stop any stool softeners and decrease your fiber supplement.  Switching to mild anti-diarrheal medications (Kayopectate, Pepto Bismol) can help.   If this worsens or does not improve, please call us.  Wound Care Remove your bandages before your first bowel movement or 8 hours after surgery.     Remove any wound packing material at this tim,e as well.  You do not need to repack the wound unless instructed otherwise.  Wear an absorbent pad or soft cotton gauze in your underwear to catch any drainage and help keep the area clean. You should change this every 2-3 hours while awake. Keep the area clean and dry.  Bathe / shower every day, especially after bowel movements.  Keep the area clean by showering / bathing over the incision / wound.   It is okay to soak an open wound to help wash it.  Wet wipes or showers / gentle washing after bowel movements is often less traumatic than regular toilet paper. You may have some styrofoam-like soft packing in the rectum which will come out with the first bowel movement.  You will often notice bleeding with bowel movements.  This should slow down by the end of the first week of surgery Expect some drainage.  This should slow down, too, by the end of the first week of surgery.  Wear an absorbent pad or soft cotton gauze in your underwear until the drainage stops. Do Not sit on a rubber or pillow ring.  This can make you symptoms worse.  You may sit on a soft pillow if needed.  ACTIVITIES as tolerated:   You may resume regular (light) daily activities beginning the next day--such as daily self-care, walking, climbing stairs--gradually increasing activities as tolerated.  If you can walk 30 minutes without difficulty, it is safe to try more intense activity such as jogging, treadmill, bicycling, low-impact aerobics, swimming, etc. Save the most intensive and strenuous activity for last such as sit-ups, heavy lifting, contact sports, etc  Refrain from any heavy lifting or straining until you are off narcotics for pain control.   You may drive when you are no longer taking prescription pain medication, you can  comfortably sit for long periods of time, and you can safely maneuver your car and apply brakes. You may have sexual intercourse when it is comfortable.  FOLLOW UP in our office Please call [facility] at [phone] to set up an appointment to see your surgeon in the office for a follow-up appointment approximately 3-4 weeks after your surgery. Make sure that you call for this appointment the day you arrive home to insure a convenient appointment time. 10. IF YOU HAVE DISABILITY OR FAMILY LEAVE FORMS, BRING THEM TO THE OFFICE FOR PROCESSING.  DO NOT GIVE THEM TO YOUR DOCTOR.     WHEN TO CALL us [phone]: Poor pain control Reactions / problems with new medications (rash/itching, nausea, etc)  Fever over 101.5 F (38.5 C) Inability to urinate Nausea and/or vomiting Worsening swelling or bruising Continued bleeding from incision. Increased pain, redness, or drainage from the incision  The clinic staff is available to answer your questions during regular business hours (8:30am-5pm).  Please don't hesitate to call and ask to speak to one of our nurses for clinical concerns.   A surgeon from [facility] is always on call at the hospitals   If you have a medical emergency, go to the nearest emergency room or call 911.    [facility] [address], [place], [place]  [address] ? MAIN: [phone] ? TOLL FREE: [phone] ? FAX [phone] [email or web address]  Information for Discharge Teaching: EXPAREL (bupivacaine liposome injectable suspension)   Your surgeon or anesthesiologist gave you EXPAREL(bupivacaine) to help control your pain after surgery.  EXPAREL is a local anesthetic that provides pain relief by numbing the tissue around the surgical site. EXPAREL is designed to release pain medication over time and can control pain for up to 72 hours. Depending on how you respond to EXPAREL, you may require less pain  medication during your recovery.  Possible side effects: Temporary loss of sensation or ability to move in the area where bupivacaine was injected. Nausea, vomiting, constipation Rarely, numbness and tingling in your mouth or lips, lightheadedness, or anxiety may occur. Call your doctor right away if you think you may be experiencing any of these sensations, or if you have other questions regarding possible side effects.  Follow all other discharge instructions given to you by your surgeon or nurse. Eat a healthy diet and drink plenty of water or other fluids.  If you return to the hospital for any reason within 96 hours following the administration of EXPAREL, it is important for health care providers to know that you have received this anesthetic. A teal colored band has been placed on your arm with the date, time and amount of EXPAREL you have received in order to alert and inform your health care providers. Please leave this armband in place for the full 96 hours following administration, and then you may remove the band.

## 2021-05-14 NOTE — Op Note (Signed)
05/14/2021  1:05 PM  PATIENT:  Meredith Taylor  55 y.o. female  Patient Care Team: Shirline Frees, MD as PCP - General (Family Medicine)  PRE-OPERATIVE DIAGNOSIS:  GRADE 3 HEMORRHOIDS  POST-OPERATIVE DIAGNOSIS:  GRADE 3 HEMORRHOIDS  PROCEDURE:  TRANSANAL HEMORRHOIDAL DEARTERIALIZATION   Surgeon(s): Leighton Ruff, MD  ASSISTANT: none   ANESTHESIA:   local and MAC  EBL: 5 ml Total I/O In: 600 [I.V.:600] Out: -   DRAINS: none   SPECIMEN:  No Specimen  DISPOSITION OF SPECIMEN:  N/A  COUNTS:  YES  PLAN OF CARE: Discharge to home after PACU  PATIENT DISPOSITION:  PACU - hemodynamically stable.  INDICATION: 55 y.o. F with bleeding grade 3 hemorrhoids   OR FINDINGS: Grade 3 R anterior and L lateral hemorrhoids, grade 2 R posterior  Description: Informed consent was confirmed. Patient underwent general anesthesia without difficulty. Patient was placed into lithotomy positioning.  The perianal region was prepped and draped in sterile fashion. Surgical time out confirmed or plan.  I did digital rectal examination and then transitioned over to anoscopy to get a sense of the anatomy.  I switched over to the [facility] fiberoptically lit Doppler anocope.   Using the Doppler on the tip of the [place] anoscope, I identified the arterial hemorrhoidal vessels coming in in the classic hexagonal anatomical pattern  (right posterior/lateral/anterior, left posterior /lateral/anterior).    I proceeded to ligate the hemorrhoidal arteries. I used a 2-0 Vicryl suture on a UR-6 needle in a figure-of-eight fashion over the signal around 6 cm proximal to the anal verge. I then ran that stitch longitudinally more distally to the dentate line. I then tied that stitch down to cause a hemorrhoidopexy. I did that for all 6 locations.    I redid Doppler anoscopy. I Identified a signal at the anterior midline location.  I isolated and ligated this with a figure-of-eight stitch. Signals went away.  At completion  of this, all hemorrhoids were reduced into the rectum.  There is no more prolapse. External anatomy looked normal.  I repeated anoscopy and examination.   Hemostasis was good. Patient is being extubated go to recovery room.  I am about to discuss the patient's status to the family.

## 2021-05-14 NOTE — Progress Notes (Signed)
Dressing changed after patient urinated.

## 2021-05-14 NOTE — H&P (Signed)
REFERRING PHYSICIAN:  Meriel Pica, MD   PROVIDER:  Elenora Gamma, MD   MRN: [id number] DOB: Dec 08, 1965 DATE OF ENCOUNTER: 05/14/21   Subjective    Chief Complaint: Hemorrhoids       History of Present Illness: Meredith Taylor is a 55 y.o. female who is seen today as an office consultation at the request of Dr. Marcelle Overlie for evaluation of Hemorrhoids.  Patient states that she has had hemorrhoids for years.  Over the past 1 to 2 years she has had an increase in symptoms.  This is mainly prolapse and leakage.  She has bleeding with bowel movements.  Last colonoscopy was approximately 3 years ago and normal per patient.  She denies any constipation or straining or frequent bowel movements.  She has never had any hemorrhoid procedures in the past.     Review of Systems: A complete review of systems was obtained from the patient.  I have reviewed this information and discussed as appropriate with the patient.  See HPI as well for other ROS.   Medical History: Past Medical History  History reviewed. No pertinent past medical history.     There is no problem list on file for this patient.     Past Surgical History       Past Surgical History:  Procedure Laterality Date   excision ovarian cyst N/A     HYSTERECTOMY       SPINE SURGERY            Allergies  No Known Allergies           Current Outpatient Medications on File Prior to Visit  Medication Sig Dispense Refill   estradioL (ESTRACE) 2 MG tablet Take by mouth         estradiol (VIVELLE-DOT) patch 0.05 mg/24 hr         loratadine (CLARITIN) 10 mg tablet Take by mouth        No current facility-administered medications on file prior to visit.      Family History       Family History  Problem Relation Age of Onset   Stroke Mother     High blood pressure (Hypertension) Mother     Hyperlipidemia (Elevated cholesterol) Mother     Coronary Artery Disease (Blocked arteries around heart) Mother     Skin  cancer Father     High blood pressure (Hypertension) Father     Hyperlipidemia (Elevated cholesterol) Father     Coronary Artery Disease (Blocked arteries around heart) Father     Diabetes Father          Social History       Tobacco Use  Smoking Status Never Smoker  Smokeless Tobacco Never Used      Social History  Social History        Socioeconomic History   Marital status: Married  Tobacco Use   Smoking status: Never Smoker   Smokeless tobacco: Never Used  Building services engineer Use: Never used  Substance and Sexual Activity   Alcohol use: Yes   Drug use: Never   Sexual activity: Defer        Objective:   BP (!) 145/92   Pulse 65   Temp 97.9 F (36.6 C) (Oral)   Resp 15   Ht 5\' 8"  (1.727 m)   Wt 69.9 kg   SpO2 100%   BMI 23.42 kg/m      Exam Gen: NAD Abd: soft Rectal  Procedure: Anoscopy Surgeon: Maisie Fus After the risks and benefits were explained, written consent was obtained for above procedure.  A medical assistant chaperone was present thoroughout the entire procedure.  Anesthesia: none Diagnosis: rectal bleeding, hemorrhoids Findings: grade 3 LL and RP hem, grade 2 RA hem     Labs, Imaging and Diagnostic Testing: none   Assessment and Plan:  Diagnoses and all orders for this visit:   Prolapsed internal hemorrhoids, grade 3       Patient with prolapsing hemorrhoids requiring manual reduction.  She is also having rectal bleeding with every bowel movement.  I recommended that she receive treatment for this.  We discussed rubber band ligation here in the office.  We discussed with grade 3 hemorrhoids the chance of success long-term is approximately 50%.  We then discussed trans hemorrhoidal dearterialization.  We discussed that this has a very good success rate for bleeding and a similar success rate for prolapse.  We discussed that this is an outpatient surgical procedure and we discussed the typical postoperative pain after surgery.  Risk  associated with the procedure are bleeding pain and recurrence.  I believe the patient understands this and wishes to proceed with trans hemorrhoidal dearterialization.  All questions were answered.

## 2021-05-14 NOTE — Anesthesia Postprocedure Evaluation (Signed)
Anesthesia Post Note  Patient: Meredith Taylor  Procedure(s) Performed: TRANSANAL HEMORRHOIDAL DEARTERIALIZATION (Rectum)     Patient location during evaluation: PACU Anesthesia Type: MAC Level of consciousness: awake and alert Pain management: pain level controlled Vital Signs Assessment: post-procedure vital signs reviewed and stable Respiratory status: spontaneous breathing, nonlabored ventilation and respiratory function stable Cardiovascular status: blood pressure returned to baseline and stable Postop Assessment: no apparent nausea or vomiting Anesthetic complications: no   No notable events documented.  Last Vitals:  Vitals:   05/14/21 1345 05/14/21 1400  BP: (!) 147/88 (!) 151/86  Pulse: 79 82  Resp: 16 18  Temp:  (!) 36.4 C  SpO2: 100% 100%    Last Pain:  Vitals:   05/14/21 1400  TempSrc:   PainSc: [place]

## 2021-05-14 NOTE — Progress Notes (Signed)
Ambulated patient to bathroom.

## 2021-05-17 ENCOUNTER — Encounter (HOSPITAL_BASED_OUTPATIENT_CLINIC_OR_DEPARTMENT_OTHER): Payer: Self-pay | Admitting: General Surgery

## 2021-06-15 DIAGNOSIS — Z0000 Encounter for general adult medical examination without abnormal findings: Secondary | ICD-10-CM | POA: Diagnosis not present

## 2021-06-21 DIAGNOSIS — R635 Abnormal weight gain: Secondary | ICD-10-CM | POA: Diagnosis not present

## 2021-06-21 DIAGNOSIS — R030 Elevated blood-pressure reading, without diagnosis of hypertension: Secondary | ICD-10-CM | POA: Diagnosis not present

## 2021-06-21 DIAGNOSIS — Z8349 Family history of other endocrine, nutritional and metabolic diseases: Secondary | ICD-10-CM | POA: Diagnosis not present

## 2021-08-02 DIAGNOSIS — M25511 Pain in right shoulder: Secondary | ICD-10-CM | POA: Diagnosis not present

## 2021-10-29 DIAGNOSIS — M7541 Impingement syndrome of right shoulder: Secondary | ICD-10-CM | POA: Diagnosis not present

## 2021-10-29 DIAGNOSIS — M19011 Primary osteoarthritis, right shoulder: Secondary | ICD-10-CM | POA: Diagnosis not present

## 2021-11-03 DIAGNOSIS — M25511 Pain in right shoulder: Secondary | ICD-10-CM | POA: Diagnosis not present

## 2021-11-19 DIAGNOSIS — M75101 Unspecified rotator cuff tear or rupture of right shoulder, not specified as traumatic: Secondary | ICD-10-CM | POA: Diagnosis not present

## 2021-11-19 DIAGNOSIS — S43431A Superior glenoid labrum lesion of right shoulder, initial encounter: Secondary | ICD-10-CM | POA: Diagnosis not present

## 2021-11-19 DIAGNOSIS — M19011 Primary osteoarthritis, right shoulder: Secondary | ICD-10-CM | POA: Diagnosis not present

## 2021-12-09 DIAGNOSIS — S43431A Superior glenoid labrum lesion of right shoulder, initial encounter: Secondary | ICD-10-CM | POA: Diagnosis not present

## 2021-12-09 DIAGNOSIS — G8918 Other acute postprocedural pain: Secondary | ICD-10-CM | POA: Diagnosis not present

## 2021-12-09 DIAGNOSIS — H2512 Age-related nuclear cataract, left eye: Secondary | ICD-10-CM | POA: Diagnosis not present

## 2021-12-09 DIAGNOSIS — M7541 Impingement syndrome of right shoulder: Secondary | ICD-10-CM | POA: Diagnosis not present

## 2021-12-09 DIAGNOSIS — M19011 Primary osteoarthritis, right shoulder: Secondary | ICD-10-CM | POA: Diagnosis not present

## 2021-12-09 DIAGNOSIS — Y999 Unspecified external cause status: Secondary | ICD-10-CM | POA: Diagnosis not present

## 2021-12-09 DIAGNOSIS — X58XXXA Exposure to other specified factors, initial encounter: Secondary | ICD-10-CM | POA: Diagnosis not present

## 2021-12-17 DIAGNOSIS — M25511 Pain in right shoulder: Secondary | ICD-10-CM | POA: Diagnosis not present

## 2021-12-22 DIAGNOSIS — M25511 Pain in right shoulder: Secondary | ICD-10-CM | POA: Diagnosis not present

## 2021-12-23 DIAGNOSIS — Z4789 Encounter for other orthopedic aftercare: Secondary | ICD-10-CM | POA: Diagnosis not present

## 2021-12-24 DIAGNOSIS — M25511 Pain in right shoulder: Secondary | ICD-10-CM | POA: Diagnosis not present

## 2021-12-27 DIAGNOSIS — M25511 Pain in right shoulder: Secondary | ICD-10-CM | POA: Diagnosis not present

## 2021-12-29 DIAGNOSIS — M25511 Pain in right shoulder: Secondary | ICD-10-CM | POA: Diagnosis not present

## 2022-01-03 DIAGNOSIS — M25511 Pain in right shoulder: Secondary | ICD-10-CM | POA: Diagnosis not present

## 2022-01-05 DIAGNOSIS — M25511 Pain in right shoulder: Secondary | ICD-10-CM | POA: Diagnosis not present

## 2022-01-10 DIAGNOSIS — Z4789 Encounter for other orthopedic aftercare: Secondary | ICD-10-CM | POA: Diagnosis not present

## 2022-01-25 DIAGNOSIS — M25511 Pain in right shoulder: Secondary | ICD-10-CM | POA: Diagnosis not present

## 2022-01-27 DIAGNOSIS — M25511 Pain in right shoulder: Secondary | ICD-10-CM | POA: Diagnosis not present

## 2022-01-31 DIAGNOSIS — M25511 Pain in right shoulder: Secondary | ICD-10-CM | POA: Diagnosis not present

## 2022-02-04 DIAGNOSIS — M25511 Pain in right shoulder: Secondary | ICD-10-CM | POA: Diagnosis not present

## 2022-02-09 DIAGNOSIS — M25511 Pain in right shoulder: Secondary | ICD-10-CM | POA: Diagnosis not present

## 2022-02-14 DIAGNOSIS — M25511 Pain in right shoulder: Secondary | ICD-10-CM | POA: Diagnosis not present

## 2022-02-17 DIAGNOSIS — M25511 Pain in right shoulder: Secondary | ICD-10-CM | POA: Diagnosis not present

## 2022-02-21 DIAGNOSIS — M25511 Pain in right shoulder: Secondary | ICD-10-CM | POA: Diagnosis not present

## 2022-04-06 DIAGNOSIS — Z4789 Encounter for other orthopedic aftercare: Secondary | ICD-10-CM | POA: Diagnosis not present

## 2022-05-06 DIAGNOSIS — M25511 Pain in right shoulder: Secondary | ICD-10-CM | POA: Diagnosis not present

## 2022-05-27 ENCOUNTER — Other Ambulatory Visit: Payer: Self-pay | Admitting: Family Medicine

## 2022-05-27 DIAGNOSIS — Z1231 Encounter for screening mammogram for malignant neoplasm of breast: Secondary | ICD-10-CM

## 2022-05-29 DIAGNOSIS — M25511 Pain in right shoulder: Secondary | ICD-10-CM | POA: Diagnosis not present

## 2022-06-02 DIAGNOSIS — Z13220 Encounter for screening for lipoid disorders: Secondary | ICD-10-CM | POA: Diagnosis not present

## 2022-06-02 DIAGNOSIS — Z131 Encounter for screening for diabetes mellitus: Secondary | ICD-10-CM | POA: Diagnosis not present

## 2022-06-02 DIAGNOSIS — Z01419 Encounter for gynecological examination (general) (routine) without abnormal findings: Secondary | ICD-10-CM | POA: Diagnosis not present

## 2022-06-02 DIAGNOSIS — Z6823 Body mass index (BMI) 23.0-23.9, adult: Secondary | ICD-10-CM | POA: Diagnosis not present

## 2022-06-02 DIAGNOSIS — Z13228 Encounter for screening for other metabolic disorders: Secondary | ICD-10-CM | POA: Diagnosis not present

## 2022-06-22 DIAGNOSIS — M19011 Primary osteoarthritis, right shoulder: Secondary | ICD-10-CM | POA: Diagnosis not present

## 2022-06-22 DIAGNOSIS — M7501 Adhesive capsulitis of right shoulder: Secondary | ICD-10-CM | POA: Diagnosis not present

## 2022-07-11 ENCOUNTER — Ambulatory Visit
Admission: RE | Admit: 2022-07-11 | Discharge: 2022-07-11 | Disposition: A | Discharge status: Home / Self Care | Payer: BC Managed Care – PPO | Source: Ambulatory Visit | Attending: Family Medicine | Admitting: Family Medicine

## 2022-07-11 DIAGNOSIS — Z1231 Encounter for screening mammogram for malignant neoplasm of breast: Secondary | ICD-10-CM | POA: Diagnosis not present

## 2022-07-12 DIAGNOSIS — M659 Synovitis and tenosynovitis, unspecified: Secondary | ICD-10-CM | POA: Diagnosis not present

## 2022-07-12 DIAGNOSIS — G8918 Other acute postprocedural pain: Secondary | ICD-10-CM | POA: Diagnosis not present

## 2022-07-12 DIAGNOSIS — M7501 Adhesive capsulitis of right shoulder: Secondary | ICD-10-CM | POA: Diagnosis not present

## 2022-07-12 DIAGNOSIS — M24511 Contracture, right shoulder: Secondary | ICD-10-CM | POA: Diagnosis not present

## 2022-07-13 DIAGNOSIS — M6281 Muscle weakness (generalized): Secondary | ICD-10-CM | POA: Diagnosis not present

## 2022-07-13 DIAGNOSIS — M25511 Pain in right shoulder: Secondary | ICD-10-CM | POA: Diagnosis not present

## 2022-07-13 DIAGNOSIS — Z4789 Encounter for other orthopedic aftercare: Secondary | ICD-10-CM | POA: Diagnosis not present

## 2022-07-13 DIAGNOSIS — M25611 Stiffness of right shoulder, not elsewhere classified: Secondary | ICD-10-CM | POA: Diagnosis not present

## 2022-07-14 DIAGNOSIS — M25511 Pain in right shoulder: Secondary | ICD-10-CM | POA: Diagnosis not present

## 2022-07-14 DIAGNOSIS — Z4789 Encounter for other orthopedic aftercare: Secondary | ICD-10-CM | POA: Diagnosis not present

## 2022-07-14 DIAGNOSIS — M6281 Muscle weakness (generalized): Secondary | ICD-10-CM | POA: Diagnosis not present

## 2022-07-14 DIAGNOSIS — M25611 Stiffness of right shoulder, not elsewhere classified: Secondary | ICD-10-CM | POA: Diagnosis not present

## 2022-07-15 DIAGNOSIS — M25611 Stiffness of right shoulder, not elsewhere classified: Secondary | ICD-10-CM | POA: Diagnosis not present

## 2022-07-15 DIAGNOSIS — M25511 Pain in right shoulder: Secondary | ICD-10-CM | POA: Diagnosis not present

## 2022-07-15 DIAGNOSIS — M6281 Muscle weakness (generalized): Secondary | ICD-10-CM | POA: Diagnosis not present

## 2022-07-15 DIAGNOSIS — Z4789 Encounter for other orthopedic aftercare: Secondary | ICD-10-CM | POA: Diagnosis not present

## 2022-07-17 DIAGNOSIS — M25611 Stiffness of right shoulder, not elsewhere classified: Secondary | ICD-10-CM | POA: Diagnosis not present

## 2022-07-17 DIAGNOSIS — M25511 Pain in right shoulder: Secondary | ICD-10-CM | POA: Diagnosis not present

## 2022-07-17 DIAGNOSIS — M6281 Muscle weakness (generalized): Secondary | ICD-10-CM | POA: Diagnosis not present

## 2022-07-17 DIAGNOSIS — Z4789 Encounter for other orthopedic aftercare: Secondary | ICD-10-CM | POA: Diagnosis not present

## 2022-07-19 DIAGNOSIS — M25611 Stiffness of right shoulder, not elsewhere classified: Secondary | ICD-10-CM | POA: Diagnosis not present

## 2022-07-19 DIAGNOSIS — M25511 Pain in right shoulder: Secondary | ICD-10-CM | POA: Diagnosis not present

## 2022-07-19 DIAGNOSIS — M6281 Muscle weakness (generalized): Secondary | ICD-10-CM | POA: Diagnosis not present

## 2022-07-19 DIAGNOSIS — Z4789 Encounter for other orthopedic aftercare: Secondary | ICD-10-CM | POA: Diagnosis not present

## 2022-07-20 DIAGNOSIS — Z4789 Encounter for other orthopedic aftercare: Secondary | ICD-10-CM | POA: Diagnosis not present

## 2022-07-20 DIAGNOSIS — M6281 Muscle weakness (generalized): Secondary | ICD-10-CM | POA: Diagnosis not present

## 2022-07-20 DIAGNOSIS — M25511 Pain in right shoulder: Secondary | ICD-10-CM | POA: Diagnosis not present

## 2022-07-20 DIAGNOSIS — M25611 Stiffness of right shoulder, not elsewhere classified: Secondary | ICD-10-CM | POA: Diagnosis not present

## 2022-07-25 DIAGNOSIS — M25611 Stiffness of right shoulder, not elsewhere classified: Secondary | ICD-10-CM | POA: Diagnosis not present

## 2022-07-25 DIAGNOSIS — M25511 Pain in right shoulder: Secondary | ICD-10-CM | POA: Diagnosis not present

## 2022-07-25 DIAGNOSIS — M6281 Muscle weakness (generalized): Secondary | ICD-10-CM | POA: Diagnosis not present

## 2022-07-25 DIAGNOSIS — Z4789 Encounter for other orthopedic aftercare: Secondary | ICD-10-CM | POA: Diagnosis not present

## 2022-07-27 DIAGNOSIS — M25611 Stiffness of right shoulder, not elsewhere classified: Secondary | ICD-10-CM | POA: Diagnosis not present

## 2022-07-27 DIAGNOSIS — M25511 Pain in right shoulder: Secondary | ICD-10-CM | POA: Diagnosis not present

## 2022-07-27 DIAGNOSIS — M6281 Muscle weakness (generalized): Secondary | ICD-10-CM | POA: Diagnosis not present

## 2022-07-27 DIAGNOSIS — Z4789 Encounter for other orthopedic aftercare: Secondary | ICD-10-CM | POA: Diagnosis not present

## 2023-01-02 DIAGNOSIS — Z1283 Encounter for screening for malignant neoplasm of skin: Secondary | ICD-10-CM | POA: Diagnosis not present

## 2023-01-02 DIAGNOSIS — C44519 Basal cell carcinoma of skin of other part of trunk: Secondary | ICD-10-CM | POA: Diagnosis not present

## 2023-01-02 DIAGNOSIS — L820 Inflamed seborrheic keratosis: Secondary | ICD-10-CM | POA: Diagnosis not present

## 2023-01-02 DIAGNOSIS — L728 Other follicular cysts of the skin and subcutaneous tissue: Secondary | ICD-10-CM | POA: Diagnosis not present

## 2023-01-02 DIAGNOSIS — D225 Melanocytic nevi of trunk: Secondary | ICD-10-CM | POA: Diagnosis not present

## 2023-04-05 DIAGNOSIS — Z0000 Encounter for general adult medical examination without abnormal findings: Secondary | ICD-10-CM | POA: Diagnosis not present

## 2023-04-05 DIAGNOSIS — E7800 Pure hypercholesterolemia, unspecified: Secondary | ICD-10-CM | POA: Diagnosis not present

## 2023-04-19 DIAGNOSIS — R0683 Snoring: Secondary | ICD-10-CM | POA: Diagnosis not present

## 2023-04-21 ENCOUNTER — Ambulatory Visit: Payer: BC Managed Care – PPO | Admitting: Podiatry

## 2023-04-21 ENCOUNTER — Ambulatory Visit (INDEPENDENT_AMBULATORY_CARE_PROVIDER_SITE_OTHER): Payer: BC Managed Care – PPO

## 2023-04-21 DIAGNOSIS — M7731 Calcaneal spur, right foot: Secondary | ICD-10-CM

## 2023-04-21 DIAGNOSIS — M1990 Unspecified osteoarthritis, unspecified site: Secondary | ICD-10-CM

## 2023-04-21 DIAGNOSIS — M722 Plantar fascial fibromatosis: Secondary | ICD-10-CM | POA: Diagnosis not present

## 2023-04-21 DIAGNOSIS — M779 Enthesopathy, unspecified: Secondary | ICD-10-CM | POA: Diagnosis not present

## 2023-04-21 NOTE — Progress Notes (Unsigned)
Subjective:   Patient ID: Meredith Taylor, female   DOB: 57 y.o.   MRN: [id number]   HPI Chief Complaint  Patient presents with   Foot Pain    Pain in right first MTP joint, also plantar fasciitis R heel    Bad for the last 6-7 months. She was walking on a treadmill and had some pf and not sure if that is part of it. She was wearing 1/2 size shoe too small and toenail came off and now has blood under it again. She gets pain on top of hte toe. Hurts to pressd own on to p of the joint. No treatment  ROS      Objective:  Physical Exam  ***     Assessment:  ***     Plan:  ***

## 2023-04-21 NOTE — Patient Instructions (Signed)

## 2023-04-28 DIAGNOSIS — J019 Acute sinusitis, unspecified: Secondary | ICD-10-CM | POA: Diagnosis not present

## 2023-05-02 LAB — HLA-B27 ANTIGEN: HLA B27: NEGATIVE

## 2023-05-02 LAB — RHEUMATOID FACTOR: Rheumatoid fact SerPl-aCnc: 10.3 [IU]/mL (ref <14.0)

## 2023-05-02 LAB — ANA: Anti Nuclear Antibody (ANA): NEGATIVE

## 2023-05-02 LAB — ANTI-CCP AB, IGG + IGA (RDL): Anti-CCP Ab, IgG + IgA (RDL): <20 U (ref <20)

## 2023-05-26 ENCOUNTER — Ambulatory Visit: Payer: BC Managed Care – PPO

## 2023-05-26 NOTE — Progress Notes (Signed)
Orthotic eval   Patient was seen, measured for custom molded foot orthotics  Patient will benefit from CFO's as they will help provide total contact to MLA's helping to better distribute body weight across BIL feet greater reducing plantar pressure and pain and to also encourage FF and RF alignment  Patient was scanned items to be ordered and fit when in  [employer], CFo, CFm

## 2023-06-12 DIAGNOSIS — G4733 Obstructive sleep apnea (adult) (pediatric): Secondary | ICD-10-CM | POA: Diagnosis not present

## 2023-06-30 DIAGNOSIS — G4733 Obstructive sleep apnea (adult) (pediatric): Secondary | ICD-10-CM | POA: Diagnosis not present

## 2023-07-30 DIAGNOSIS — G4733 Obstructive sleep apnea (adult) (pediatric): Secondary | ICD-10-CM | POA: Diagnosis not present

## 2023-08-07 DIAGNOSIS — Z01419 Encounter for gynecological examination (general) (routine) without abnormal findings: Secondary | ICD-10-CM | POA: Diagnosis not present

## 2023-08-07 DIAGNOSIS — Z6823 Body mass index (BMI) 23.0-23.9, adult: Secondary | ICD-10-CM | POA: Diagnosis not present

## 2023-08-30 DIAGNOSIS — G4733 Obstructive sleep apnea (adult) (pediatric): Secondary | ICD-10-CM | POA: Diagnosis not present

## 2023-09-14 DIAGNOSIS — G4733 Obstructive sleep apnea (adult) (pediatric): Secondary | ICD-10-CM | POA: Diagnosis not present

## 2023-09-29 DIAGNOSIS — J019 Acute sinusitis, unspecified: Secondary | ICD-10-CM | POA: Diagnosis not present

## 2023-09-30 DIAGNOSIS — G4733 Obstructive sleep apnea (adult) (pediatric): Secondary | ICD-10-CM | POA: Diagnosis not present

## 2023-10-28 DIAGNOSIS — G4733 Obstructive sleep apnea (adult) (pediatric): Secondary | ICD-10-CM | POA: Diagnosis not present

## 2023-12-28 DIAGNOSIS — G4733 Obstructive sleep apnea (adult) (pediatric): Secondary | ICD-10-CM | POA: Diagnosis not present

## 2024-01-28 DIAGNOSIS — G4733 Obstructive sleep apnea (adult) (pediatric): Secondary | ICD-10-CM | POA: Diagnosis not present

## 2024-02-27 DIAGNOSIS — G4733 Obstructive sleep apnea (adult) (pediatric): Secondary | ICD-10-CM | POA: Diagnosis not present

## 2024-03-29 DIAGNOSIS — G4733 Obstructive sleep apnea (adult) (pediatric): Secondary | ICD-10-CM | POA: Diagnosis not present

## 2024-04-17 DIAGNOSIS — M2550 Pain in unspecified joint: Secondary | ICD-10-CM | POA: Diagnosis not present

## 2024-04-17 DIAGNOSIS — E7800 Pure hypercholesterolemia, unspecified: Secondary | ICD-10-CM | POA: Diagnosis not present

## 2024-04-17 DIAGNOSIS — J301 Allergic rhinitis due to pollen: Secondary | ICD-10-CM | POA: Diagnosis not present

## 2024-04-17 DIAGNOSIS — Z0000 Encounter for general adult medical examination without abnormal findings: Secondary | ICD-10-CM | POA: Diagnosis not present

## 2024-04-17 DIAGNOSIS — D225 Melanocytic nevi of trunk: Secondary | ICD-10-CM | POA: Diagnosis not present

## 2024-04-17 DIAGNOSIS — D2272 Melanocytic nevi of left lower limb, including hip: Secondary | ICD-10-CM | POA: Diagnosis not present

## 2024-04-17 DIAGNOSIS — L821 Other seborrheic keratosis: Secondary | ICD-10-CM | POA: Diagnosis not present

## 2024-04-17 DIAGNOSIS — R197 Diarrhea, unspecified: Secondary | ICD-10-CM | POA: Diagnosis not present

## 2024-04-17 DIAGNOSIS — L820 Inflamed seborrheic keratosis: Secondary | ICD-10-CM | POA: Diagnosis not present

## 2024-04-17 DIAGNOSIS — H6993 Unspecified Eustachian tube disorder, bilateral: Secondary | ICD-10-CM | POA: Diagnosis not present

## 2024-04-17 DIAGNOSIS — D485 Neoplasm of uncertain behavior of skin: Secondary | ICD-10-CM | POA: Diagnosis not present

## 2024-04-17 DIAGNOSIS — R5383 Other fatigue: Secondary | ICD-10-CM | POA: Diagnosis not present

## 2024-05-01 DIAGNOSIS — L988 Other specified disorders of the skin and subcutaneous tissue: Secondary | ICD-10-CM | POA: Diagnosis not present

## 2024-05-01 DIAGNOSIS — L089 Local infection of the skin and subcutaneous tissue, unspecified: Secondary | ICD-10-CM | POA: Diagnosis not present
